# Patient Record
Sex: Female | Born: 1935 | Race: Asian | Hispanic: No | Smoking: Former smoker
Health system: Southern US, Community
[De-identification: ages and names within clinical notes are randomized; demographics above are authoritative.]

## PROBLEM LIST (undated history)

## (undated) DIAGNOSIS — Z5189 Encounter for other specified aftercare: Secondary | ICD-10-CM

## (undated) DIAGNOSIS — F329 Major depressive disorder, single episode, unspecified: Secondary | ICD-10-CM

## (undated) DIAGNOSIS — F32A Depression, unspecified: Secondary | ICD-10-CM

## (undated) DIAGNOSIS — R1310 Dysphagia, unspecified: Secondary | ICD-10-CM

## (undated) DIAGNOSIS — E785 Hyperlipidemia, unspecified: Secondary | ICD-10-CM

## (undated) DIAGNOSIS — K219 Gastro-esophageal reflux disease without esophagitis: Secondary | ICD-10-CM

## (undated) DIAGNOSIS — T8859XA Other complications of anesthesia, initial encounter: Secondary | ICD-10-CM

## (undated) DIAGNOSIS — S82843A Displaced bimalleolar fracture of unspecified lower leg, initial encounter for closed fracture: Secondary | ICD-10-CM

## (undated) DIAGNOSIS — R0602 Shortness of breath: Secondary | ICD-10-CM

## (undated) DIAGNOSIS — M810 Age-related osteoporosis without current pathological fracture: Secondary | ICD-10-CM

## (undated) DIAGNOSIS — Z9889 Other specified postprocedural states: Secondary | ICD-10-CM

## (undated) DIAGNOSIS — K623 Rectal prolapse: Secondary | ICD-10-CM

## (undated) DIAGNOSIS — M1990 Unspecified osteoarthritis, unspecified site: Secondary | ICD-10-CM

## (undated) DIAGNOSIS — T4145XA Adverse effect of unspecified anesthetic, initial encounter: Secondary | ICD-10-CM

## (undated) DIAGNOSIS — R112 Nausea with vomiting, unspecified: Secondary | ICD-10-CM

## (undated) HISTORY — PX: CATARACT EXTRACTION: SUR2

## (undated) HISTORY — DX: Encounter for other specified aftercare: Z51.89

## (undated) HISTORY — PX: COLONOSCOPY: SHX174

## (undated) HISTORY — PX: FRACTURE SURGERY: SHX138

## (undated) HISTORY — PX: HEMORRHOID SURGERY: SHX153

## (undated) HISTORY — PX: TOE SURGERY: SHX1073

---

## 1999-03-02 ENCOUNTER — Encounter: Payer: Self-pay | Admitting: Cardiology

## 1999-03-02 ENCOUNTER — Encounter: Admission: RE | Admit: 1999-03-02 | Discharge: 1999-03-02 | Payer: Self-pay | Admitting: Cardiology

## 1999-04-12 ENCOUNTER — Other Ambulatory Visit: Admission: RE | Admit: 1999-04-12 | Discharge: 1999-04-12 | Payer: Self-pay | Admitting: Cardiology

## 2000-03-13 ENCOUNTER — Encounter: Payer: Self-pay | Admitting: Internal Medicine

## 2000-03-13 ENCOUNTER — Encounter: Admission: RE | Admit: 2000-03-13 | Discharge: 2000-03-13 | Payer: Self-pay | Admitting: Internal Medicine

## 2000-05-27 ENCOUNTER — Encounter (INDEPENDENT_AMBULATORY_CARE_PROVIDER_SITE_OTHER): Payer: Self-pay | Admitting: Specialist

## 2000-05-27 ENCOUNTER — Ambulatory Visit (HOSPITAL_COMMUNITY): Admission: RE | Admit: 2000-05-27 | Discharge: 2000-05-27 | Payer: Self-pay | Admitting: Gastroenterology

## 2000-12-06 ENCOUNTER — Emergency Department (HOSPITAL_COMMUNITY): Admission: EM | Admit: 2000-12-06 | Discharge: 2000-12-06 | Payer: Self-pay | Admitting: Emergency Medicine

## 2001-03-18 ENCOUNTER — Encounter: Admission: RE | Admit: 2001-03-18 | Discharge: 2001-03-18 | Payer: Self-pay | Admitting: Internal Medicine

## 2001-03-18 ENCOUNTER — Encounter: Payer: Self-pay | Admitting: Internal Medicine

## 2002-04-10 ENCOUNTER — Encounter: Admission: RE | Admit: 2002-04-10 | Discharge: 2002-04-10 | Payer: Self-pay | Admitting: Internal Medicine

## 2002-04-10 ENCOUNTER — Encounter: Payer: Self-pay | Admitting: Internal Medicine

## 2002-07-13 ENCOUNTER — Encounter: Admission: RE | Admit: 2002-07-13 | Discharge: 2002-07-13 | Payer: Self-pay | Admitting: Internal Medicine

## 2002-07-13 ENCOUNTER — Encounter: Payer: Self-pay | Admitting: Internal Medicine

## 2002-08-12 ENCOUNTER — Encounter: Payer: Self-pay | Admitting: General Surgery

## 2002-08-17 ENCOUNTER — Ambulatory Visit (HOSPITAL_COMMUNITY): Admission: RE | Admit: 2002-08-17 | Discharge: 2002-08-17 | Payer: Self-pay | Admitting: General Surgery

## 2002-08-17 ENCOUNTER — Encounter (INDEPENDENT_AMBULATORY_CARE_PROVIDER_SITE_OTHER): Payer: Self-pay

## 2003-06-08 ENCOUNTER — Encounter: Admission: RE | Admit: 2003-06-08 | Discharge: 2003-06-08 | Payer: Self-pay | Admitting: Internal Medicine

## 2003-11-15 ENCOUNTER — Encounter: Admission: RE | Admit: 2003-11-15 | Discharge: 2003-11-15 | Payer: Self-pay | Admitting: Internal Medicine

## 2004-06-20 ENCOUNTER — Encounter: Admission: RE | Admit: 2004-06-20 | Discharge: 2004-06-20 | Payer: Self-pay | Admitting: Internal Medicine

## 2004-12-05 ENCOUNTER — Ambulatory Visit (HOSPITAL_COMMUNITY): Admission: RE | Admit: 2004-12-05 | Discharge: 2004-12-05 | Payer: Self-pay | Admitting: Ophthalmology

## 2005-06-29 ENCOUNTER — Encounter: Admission: RE | Admit: 2005-06-29 | Discharge: 2005-06-29 | Payer: Self-pay | Admitting: Internal Medicine

## 2006-07-02 ENCOUNTER — Encounter: Admission: RE | Admit: 2006-07-02 | Discharge: 2006-07-02 | Payer: Self-pay | Admitting: Internal Medicine

## 2007-07-30 ENCOUNTER — Encounter: Admission: RE | Admit: 2007-07-30 | Discharge: 2007-07-30 | Payer: Self-pay | Admitting: Internal Medicine

## 2008-04-09 ENCOUNTER — Encounter: Admission: RE | Admit: 2008-04-09 | Discharge: 2008-04-09 | Payer: Self-pay | Admitting: Internal Medicine

## 2008-04-13 ENCOUNTER — Encounter: Admission: RE | Admit: 2008-04-13 | Discharge: 2008-04-13 | Payer: Self-pay | Admitting: Internal Medicine

## 2009-03-15 ENCOUNTER — Encounter: Admission: RE | Admit: 2009-03-15 | Discharge: 2009-03-15 | Payer: Self-pay | Admitting: Internal Medicine

## 2010-03-16 ENCOUNTER — Encounter: Admission: RE | Admit: 2010-03-16 | Discharge: 2010-03-16 | Payer: Self-pay | Admitting: Internal Medicine

## 2010-09-15 NOTE — Op Note (Signed)
NAMEROBERTTA, [name]                 ACCOUNT NO.:  [id number]   MEDICAL RECORD NO.:  [id number]          PATIENT TYPE:  OIB   LOCATION:  NA                           FACILITY:  [facility]   PHYSICIAN:  Guadelupe Sabin, M.D.DATE OF BIRTH:  07/07/1925   DATE OF PROCEDURE:  12/05/2004  DATE OF DISCHARGE:                                 OPERATIVE REPORT   PREOPERATIVE DIAGNOSIS:  Senile nuclear cataract, left eye.   POSTOPERATIVE DIAGNOSIS:  Senile nuclear cataract, left eye.   DATE OF OPERATION:  Planned extracapsular cataract extraction -  phacoemulsification, primary insertion of posterior chamber intraocular lens  implant.   SURGEON:  Dr. Cecilie Kicks.   ASSISTANT:  Nurse.   ANESTHESIA:  Local 4% Xylocaine, 0.75 Marcaine with Wydase added  retrobulbar block, topical tetracaine, intraocular Xylocaine. Anesthesia  standby required. Patient given sodium Pentothal intravenously during the  period of retrobulbar blocking.   OPERATIVE PROCEDURE:  After the patient was prepped and draped, a lid  speculum was inserted in the left eye. Schiotz tonometry was recorded at 4  to 5 scale units with a 5.5 grams weight. A peritomy was performed adjacent  to the limbus from the 11 to the 1 o'clock position. The corneoscleral  junction was cleaned and a corneoscleral groove made with a 45 degrees super  blade.  The anterior chamber was then entered with a 2.5 mm diamond keratome  at the 12 o'clock position and a 15 degrees blade at the 2:30 position.  Using a bent 26 gauge needle on an OcuCoat syringe, a circular  capsulorrhexis was begun, but was difficult to form a smooth circle.  The  capsulotomy was fragmented, but allowed adequate opening to the nuclear and  cortical cataract. Hydrodissection and hydrodelineation were performed using  1% Xylocaine. The 30 degree phacoemulsification tip was then inserted with  slow controlled emulsification of the lens nucleus with back cracking and  fragmentation  with the Bechert pick. Total ultrasonic time 1 minute 8  seconds, average power level 14%, total amount of fluid used 90 mL.  Following removal of the nucleus. The residual cortex was aspirated with the  silicone tipped irrigation-aspiration probe. The posterior capsule appeared  intact with a brilliant red fundus reflex.  It was therefore elected to  insert an [employer] SI 40NB silicone three-piece posterior  chamber intraocular lens implant, diopter strength +22.00. This was inserted  with the McDonald forceps into the anterior chamber. The superior haptic of  the intraocular lens spontaneously was placed in the capsular bag. The lens  was then rotated and the inferior haptic placed in the bag with the [facility]  lens rotator. The lens itself was then turned slightly and appeared to be in  good centered position. The OcuCoat and Provisc which had been used  intermittently during the procedure were then aspirated and replaced with  balanced salt solution and Miochol ophthalmic solution. A single 10-0  interrupted nylon suture was used to close the larger incision at the 12  o'clock position.  There did not appeared to be any external leakage.  Maxitrol ointment  was instilled in the  conjunctival cul-de-sac and a light patch protector shield applied. Duration  of procedure and anesthesia administration, 45 minutes. The patient  tolerated the procedure well in general, left the operating room to the  recovery room in good condition.       HNJ/MEDQ  D:  12/05/2004  T:  12/05/2004  Job:  [id number]   cc:   Loraine Leriche A. Waynard Edwards, M.D.  [address]  [place]  [place] [address]  Fax: [phone]

## 2010-09-15 NOTE — H&P (Signed)
Jillian [name], Jillian [name]                 ACCOUNT NO.:  [id number]   MEDICAL RECORD NO.:  [id number]          PATIENT TYPE:  OIB   LOCATION:  NA                           FACILITY:  [facility]   PHYSICIAN:  Guadelupe Sabin, M.D.DATE OF BIRTH:  Sep 08, 1925   DATE OF ADMISSION:  12/05/2004  DATE OF DISCHARGE:                                HISTORY & PHYSICAL   This was a planned outpatient surgical admission of this 77 year old female,  admitted to for cataract implant surgery of the left eye.   PRESENT ILLNESS:  This patient has been followed in my office, since November 23, 1966, for routine eye care.  Initial examination revealed a visual  acuity of 20/20 right eye, 20/25 left eye without correction.  Over the  ensuing years, the patient has done well without major eye problems.  Recently, in 2000 the patient was noted to have early nuclear cataract  formation and this has very slowly progressed.  The patient has had  difficulty seeing with and without her glasses and when seen in July she  stated that she was now having to close the right eye to see well.  The  patient noted it was getting increasingly difficult to read, and that she  was having to use a magnifier.  Examination revealed a visual acuity of  20/25 right eye, 20/25 left eye with correction at distance and 20/30 to  20/40 at near.  A dense nuclear cataract formation was present and it was  felt advisable to proceed with cataract implant surgery of the left eye now,  right eye later.  The patient was given oral discussion and printed  information concerning the procedure and its possible complications.  She  signed an informed consent and arrangements made for outpatient admission at  this time.   PAST MEDICAL HISTORY:  The patient is under the care of Dr.  Waynard Edwards.  Please  send him copies of these notes and any associated laboratory data, EKG or  chest x-ray material.   CURRENT MEDICATIONS:  Prilosec, vitamins, calcium, Tylenol,  Relenza, Evista,  Caltrate, Centrum Silver.   The patient is felt to be in generally good condition and an acceptable risk  for the proposed surgery under local anesthesia.   ALLERGIES:  None.   REVIEW OF SYSTEMS:  No cardiorespiratory complaints.   PHYSICAL EXAMINATION:  VITAL SIGNS:  As recorded on admission, blood  pressure 116/71, pulse 52, respirations 16, temperature 97.2.  GENERAL APPEARANCE:  The patient is a pleasant well-nourished, well-  developed [place] female in no acute distress.  HEENT:  EYES:  Visual acuity as noted above.  Slit lamp examination:  Dense  nuclear cataract formation.  Applanation tonometry 16-mm each eye.  Dilated  fundus examination:  Cataract lens haze and dimness.  The vitreous is clear.  Retina attached with normal optic nerve blood vessels and macula.  CHEST:  Lungs clear to percussion/auscultation.  HEART:  Normal sinus rhythm.  No cardiomegaly.  No murmurs.  ABDOMEN:  Negative.  EXTREMITIES:  Negative.   ADMISSION DIAGNOSIS:  Senile nuclear cataract, both eyes.  SURGICAL PLAN:  Cataract implant surgery left eye now, right eye later.       HNJ/MEDQ  D:  12/05/2004  T:  12/05/2004  Job:  [id number]   cc:   Loraine Leriche A. Waynard Edwards, M.D.  [address]  [place]  [place] [address]  Fax: [phone]

## 2011-03-02 ENCOUNTER — Other Ambulatory Visit: Payer: Self-pay | Admitting: Internal Medicine

## 2011-03-02 DIAGNOSIS — Z1231 Encounter for screening mammogram for malignant neoplasm of breast: Secondary | ICD-10-CM

## 2011-03-31 HISTORY — PX: TEAR DUCT PROBING: SHX793

## 2011-04-09 ENCOUNTER — Ambulatory Visit
Admission: RE | Admit: 2011-04-09 | Discharge: 2011-04-09 | Disposition: A | Discharge status: Home / Self Care | Payer: Medicare Other | Source: Ambulatory Visit | Attending: Internal Medicine | Admitting: Internal Medicine

## 2011-04-09 DIAGNOSIS — Z1231 Encounter for screening mammogram for malignant neoplasm of breast: Secondary | ICD-10-CM

## 2011-11-06 ENCOUNTER — Inpatient Hospital Stay (HOSPITAL_COMMUNITY)
Admission: EM | Admit: 2011-11-06 | Discharge: 2011-11-10 | DRG: 494 | Disposition: A | Discharge status: Home Health | Payer: Medicare Other | Attending: Orthopedic Surgery | Admitting: Orthopedic Surgery

## 2011-11-06 ENCOUNTER — Emergency Department (HOSPITAL_COMMUNITY): Payer: Medicare Other

## 2011-11-06 ENCOUNTER — Encounter (HOSPITAL_COMMUNITY): Payer: Self-pay | Admitting: Emergency Medicine

## 2011-11-06 DIAGNOSIS — Z886 Allergy status to analgesic agent status: Secondary | ICD-10-CM

## 2011-11-06 DIAGNOSIS — Z7901 Long term (current) use of anticoagulants: Secondary | ICD-10-CM

## 2011-11-06 DIAGNOSIS — W108XXA Fall (on) (from) other stairs and steps, initial encounter: Secondary | ICD-10-CM | POA: Diagnosis present

## 2011-11-06 DIAGNOSIS — Y92009 Unspecified place in unspecified non-institutional (private) residence as the place of occurrence of the external cause: Secondary | ICD-10-CM

## 2011-11-06 DIAGNOSIS — S82843A Displaced bimalleolar fracture of unspecified lower leg, initial encounter for closed fracture: Principal | ICD-10-CM | POA: Diagnosis present

## 2011-11-06 DIAGNOSIS — Z882 Allergy status to sulfonamides status: Secondary | ICD-10-CM

## 2011-11-06 DIAGNOSIS — Z79899 Other long term (current) drug therapy: Secondary | ICD-10-CM

## 2011-11-06 DIAGNOSIS — S82209A Unspecified fracture of shaft of unspecified tibia, initial encounter for closed fracture: Secondary | ICD-10-CM

## 2011-11-06 DIAGNOSIS — M810 Age-related osteoporosis without current pathological fracture: Secondary | ICD-10-CM | POA: Diagnosis present

## 2011-11-06 HISTORY — DX: Age-related osteoporosis without current pathological fracture: M81.0

## 2011-11-06 HISTORY — DX: Displaced bimalleolar fracture of unspecified lower leg, initial encounter for closed fracture: S82.843A

## 2011-11-06 LAB — CBC WITH DIFFERENTIAL/PLATELET
Eosinophils Absolute: 0 10*3/uL (ref 0.0–0.7)
Eosinophils Relative: 0 % (ref 0–5)
Lymphs Abs: 0.9 10*3/uL (ref 0.7–4.0)
MCH: 33.2 pg (ref 26.0–34.0)
MCV: 96.1 fL (ref 78.0–100.0)
Monocytes Absolute: 0.5 10*3/uL (ref 0.1–1.0)
Platelets: 127 10*3/uL — ABNORMAL LOW (ref 150–400)
RBC: 3.85 MIL/uL — ABNORMAL LOW (ref 3.87–5.11)
RDW: 12.7 % (ref 11.5–15.5)

## 2011-11-06 LAB — PROTIME-INR
INR: 1.12 (ref 0.00–1.49)
Prothrombin Time: 14.6 seconds (ref 11.6–15.2)

## 2011-11-06 LAB — COMPREHENSIVE METABOLIC PANEL
ALT: 9 U/L (ref 0–35)
Calcium: 9.2 mg/dL (ref 8.4–10.5)
Creatinine, Ser: 0.7 mg/dL (ref 0.50–1.10)
GFR calc Af Amer: 90 mL/min — ABNORMAL LOW (ref >90)
Glucose, Bld: 123 mg/dL — ABNORMAL HIGH (ref 70–99)
Sodium: 137 mEq/L (ref 135–145)
Total Protein: 6.5 g/dL (ref 6.0–8.3)

## 2011-11-06 MED ORDER — HYDROMORPHONE BOLUS VIA INFUSION: 1.0000 mg | INTRAVENOUS | Status: DC | PRN

## 2011-11-06 MED ORDER — METHOCARBAMOL 100 MG/ML IJ SOLN
500.0000 mg | Freq: Four times a day (QID) | INTRAMUSCULAR | Status: DC | PRN
  Administered 2011-11-07: 500 mg via INTRAVENOUS
  Filled 2011-11-06 (×3): qty 5

## 2011-11-06 MED ORDER — ONDANSETRON HCL 4 MG/2ML IJ SOLN: 4.0000 mg | Freq: Three times a day (TID) | INTRAMUSCULAR | Status: AC | PRN

## 2011-11-06 MED ORDER — SODIUM CHLORIDE 0.9 % IV SOLN
INTRAVENOUS | Status: AC
  Administered 2011-11-06 (×2): via INTRAVENOUS

## 2011-11-06 MED ORDER — HYDROMORPHONE HCL PF 1 MG/ML IJ SOLN
1.0000 mg | INTRAMUSCULAR | Status: DC | PRN
  Administered 2011-11-06 – 2011-11-08 (×5): 1 mg via INTRAVENOUS
  Filled 2011-11-06 (×5): qty 1

## 2011-11-06 MED ORDER — OXYCODONE-ACETAMINOPHEN 5-325 MG PO TABS
2.0000 | ORAL_TABLET | Freq: Once | ORAL | Status: AC
  Administered 2011-11-06: 2 via ORAL
  Filled 2011-11-06: qty 2

## 2011-11-06 MED ORDER — HYDROMORPHONE HCL PF 1 MG/ML IJ SOLN
1.0000 mg | INTRAMUSCULAR | Status: AC | PRN
  Administered 2011-11-06: 1 mg via INTRAVENOUS
  Filled 2011-11-06: qty 1

## 2011-11-06 NOTE — H&P (Signed)
Jillian [name] is an 78 y.o. female.   Chief Complaint: Pain in left ankle. HPI: Pt. Missed a step at home while feeding her cat. She had immediate pain in her left ankle and could not put weight on her ankle.  ER xrays show a displaced left bimalleolar ankle fracture needing open reduction and internal fixation.  Past Medical History  Diagnosis Date  . Osteoporosis   . Ankle fracture, bimalleolar, closed     History reviewed. No pertinent past surgical history.  No family history on file. Social History:  reports that she has never smoked. She does not have any smokeless tobacco history on file. She reports that she does not drink alcohol or use illicit drugs.  Allergies:  Allergies  Allergen Reactions  . Aspirin Other (See Comments)    GI upset  . Ibuprofen Other (See Comments)    GI bleed precautions  . Sulfa Antibiotics Hives    Medications Prior to Admission  Medication Sig Dispense Refill  . cholecalciferol (VITAMIN D) 1000 UNITS tablet Take 1,000 Units by mouth daily.      Marland Kitchen escitalopram (LEXAPRO) 5 MG tablet Take 5 mg by mouth daily.      Marland Kitchen omeprazole (PRILOSEC) 20 MG capsule Take 20 mg by mouth daily.      . raloxifene (EVISTA) 60 MG tablet Take 60 mg by mouth daily.        Results for orders placed during the hospital encounter of 11/06/11 (from the past 48 hour(s))  CBC WITH DIFFERENTIAL     Status: Abnormal   Collection Time   11/06/11  1:42 AM      Component Value Range Comment   WBC 5.8  4.0 - 10.5 K/uL    RBC 3.85 (*) 3.87 - 5.11 MIL/uL    Hemoglobin 12.8  12.0 - 15.0 g/dL    HCT 16.1  09.6 - 04.5 %    MCV 96.1  78.0 - 100.0 fL    MCH 33.2  26.0 - 34.0 pg    MCHC 34.6  30.0 - 36.0 g/dL    RDW 40.9  81.1 - 91.4 %    Platelets 127 (*) 150 - 400 K/uL    Neutrophils Relative 75  43 - 77 %    Neutro Abs 4.3  1.7 - 7.7 K/uL    Lymphocytes Relative 15  12 - 46 %    Lymphs Abs 0.9  0.7 - 4.0 K/uL    Monocytes Relative 9  3 - 12 %    Monocytes Absolute 0.5  0.1  - 1.0 K/uL    Eosinophils Relative 0  0 - 5 %    Eosinophils Absolute 0.0  0.0 - 0.7 K/uL    Basophils Relative 0  0 - 1 %    Basophils Absolute 0.0  0.0 - 0.1 K/uL   COMPREHENSIVE METABOLIC PANEL     Status: Abnormal   Collection Time   11/06/11  1:42 AM      Component Value Range Comment   Sodium 137  135 - 145 mEq/L    Potassium 3.8  3.5 - 5.1 mEq/L    Chloride 100  96 - 112 mEq/L    CO2 26  19 - 32 mEq/L    Glucose, Bld 123 (*) 70 - 99 mg/dL    BUN 12  6 - 23 mg/dL    Creatinine, Ser 7.82  0.50 - 1.10 mg/dL    Calcium 9.2  8.4 - 95.6 mg/dL  Total Protein 6.5  6.0 - 8.3 g/dL    Albumin 3.6  3.5 - 5.2 g/dL    AST 22  0 - 37 U/L    ALT 9  0 - 35 U/L    Alkaline Phosphatase 62  39 - 117 U/L    Total Bilirubin 0.4  0.3 - 1.2 mg/dL    GFR calc non Af Amer 77 (*) >90 mL/min    GFR calc Af Amer 90 (*) >90 mL/min    Dg Tibia/fibula Left  11/06/2011  *RADIOLOGY REPORT*  Clinical Data: Fall  LEFT TIBIA AND FIBULA - 2 VIEW  Comparison: None.  Findings: Distal intra-articular tibia and fibula fractures are noted.  Mid and proximal tibia and fibula are intact.  IMPRESSION: Distal tibia and fibula fractures.  Original Report Authenticated By: Donavan Burnet, M.D.   Dg Tibia/fibula Right  11/06/2011  *RADIOLOGY REPORT*  Clinical Data: Fall  RIGHT TIBIA AND FIBULA - 2 VIEW  Comparison: None.  Findings: No acute fracture and no dislocation.  Chondrocalcinosis in the knee is present.  IMPRESSION: No acute bony pathology.  Original Report Authenticated By: Donavan Burnet, M.D.   Dg Ankle Complete Left  11/06/2011  *RADIOLOGY REPORT*  Clinical Data: Fall  LEFT ANKLE COMPLETE - 3+ VIEW  Comparison: None.  Findings: There is a fracture through the distal fibula extending into the ankle joint.  The fracture begins at the tibial plafond and extends superiorly.  Fracture fragments are displaced.  There is also fracture involving the medial malleolus extending into the ankle joint.  There is widening of the  medial ankle mortise.  There is slight anterior subluxation of the tibial plafond with respect to the talar dome.  Talus and calcaneus are intact.  IMPRESSION: Bimalleolar fracture with disruption of the mortis.  Original Report Authenticated By: Donavan Burnet, M.D.   Dg Ankle Complete Right  11/06/2011  *RADIOLOGY REPORT*  Clinical Data: Fall  RIGHT ANKLE - COMPLETE 3+ VIEW  Comparison: None.  Findings: No acute fracture and no dislocation.  IMPRESSION: No acute bony pathology.  Original Report Authenticated By: Donavan Burnet, M.D.   Dg Foot Complete Left  11/06/2011  *RADIOLOGY REPORT*  Clinical Data: Fall  LEFT FOOT - COMPLETE 3+ VIEW  Comparison: None.  Findings: Fractures of the distal tibia and fibula are noted. Phalanges and tarsal bones are intact.  Talus and calcaneus are grossly intact.  IMPRESSION: No acute bony injury in the foot.  Distal tibia and fibula fractures are noted.  Original Report Authenticated By: Donavan Burnet, M.D.    Review of Systems  Constitutional: Negative.   HENT: Negative.   Eyes: Negative.   Respiratory: Negative.   Cardiovascular: Negative.   Gastrointestinal: Negative.   Genitourinary: Positive for frequency.  Musculoskeletal: Positive for joint pain.  Skin: Negative.   Neurological: Negative.   Endo/Heme/Allergies: Bruises/bleeds easily.  Psychiatric/Behavioral: Negative.     Blood pressure 108/59, pulse 55, temperature 97.6 F (36.4 C), temperature source Oral, resp. rate 18, height 5\' 2"  (1.575 m), weight 62.3 kg (137 lb 5.6 oz), SpO2 99.00%. Physical Exam  Constitutional: She is oriented to person, place, and time. She appears well-developed.  HENT:  Head: Normocephalic and atraumatic.  Eyes: Conjunctivae are normal.  Neck: Normal range of motion.  Cardiovascular: Normal rate.   Respiratory: Effort normal and breath sounds normal.  GI: Soft. Bowel sounds are normal.  Musculoskeletal: She exhibits tenderness.  Neurological: She is alert and  oriented to person,  place, and time.  Skin: Skin is warm.  Psychiatric: She has a normal mood and affect. Judgment normal.     Assessment/Plan Displaced left ankle fracture/ ORIF Lt. Ankle   Taejah Ohalloran III,Tristan Bramble L 11/06/2011, 8:07 AM

## 2011-11-06 NOTE — ED Notes (Signed)
PT. ARRIVED WITH PTAR FROM HOME TRIPPED WHILE GOING DOWN STAIR ( 3 STEPS FROM FLOOR) AND TWISTES LEFT ANKLE , PRESENTS WITH PAIN /SWELLING AT LEFT ANKLE / SMALL ABRASION AT RIGHT FOOT , NO LOC .

## 2011-11-06 NOTE — ED Notes (Signed)
TRANSPORTED TO X-RAY. 

## 2011-11-06 NOTE — ED Provider Notes (Signed)
History     CSN: [id number]  Arrival date & time 11/06/11  0013   First MD Initiated Contact with Patient 11/06/11 0029      Chief Complaint  Patient presents with  . Ankle Injury    (Consider location/radiation/quality/duration/timing/severity/associated sxs/prior treatment) Patient is a 78 y.o. female presenting with lower extremity injury. The history is provided by the patient. No language interpreter was used.  Ankle Injury This is a new problem. The current episode started today. The problem occurs constantly. The problem has been unchanged. Pertinent negatives include no chest pain, fever, nausea, neck pain, vomiting or weakness. The symptoms are aggravated by bending. She has tried nothing for the symptoms.  78 year old female coming in complaining of bilateral ankle pain after falling down steps carrying a box down steps. States that she got her husband's attention who has Alzheimer's and he brought her the phone and she called her daughter. Daughter came and called 911. Deformity noted to the right than the left ankle. 2+ bilateral pedal pulses. Patient has good CMS to the seats. Limited range of motion due to pain. States that her orthopedic Dr. Is Dr. Ethelene Hal. Past medical history of osteoporosis and chronic pain. Denies loss of consciousness or hitting her head.  Past Medical History  Diagnosis Date  . Osteoporosis     History reviewed. No pertinent past surgical history.  No family history on file.  History  Substance Use Topics  . Smoking status: Never Smoker   . Smokeless tobacco: Not on file  . Alcohol Use: No    OB History    Grav Para Term Preterm Abortions TAB SAB Ect Mult Living                  Review of Systems  Constitutional: Negative.  Negative for fever.  HENT: Negative.  Negative for neck pain.   Eyes: Negative.   Respiratory: Negative.   Cardiovascular: Negative.  Negative for chest pain.  Gastrointestinal: Negative.  Negative for nausea and  vomiting.  Musculoskeletal:       Ankle pain bilateral  Neurological: Negative.  Negative for weakness.  Psychiatric/Behavioral: Negative.   All other systems reviewed and are negative.    Allergies  Review of patient's allergies indicates not on file.  Home Medications  No current outpatient prescriptions on file.  BP 155/62  Pulse 58  Temp 97.8 F (36.6 C) (Oral)  Resp 28  SpO2 100%  Physical Exam  Nursing note and vitals reviewed. Constitutional: She is oriented to person, place, and time. She appears well-developed and well-nourished.  HENT:  Head: Normocephalic and atraumatic.  Eyes: Conjunctivae and EOM are normal. Pupils are equal, round, and reactive to light.  Neck: Normal range of motion. Neck supple.  Cardiovascular: Normal rate.   Pulmonary/Chest: Effort normal.  Abdominal: Soft.  Musculoskeletal: Normal range of motion. She exhibits edema and tenderness.       Bilateral ankle tenderness with deformity noted to L ankle  Neurological: She is alert and oriented to person, place, and time. She has normal reflexes.  Skin: Skin is warm and dry.  Psychiatric: She has a normal mood and affect.    ED Course  Procedures (including critical care time)  Labs Reviewed - No data to display No results found.   No diagnosis found.    MDM  Patient will lead admitted to unit 5000 for tib-fib fracture on the left. Dr. Alfredo Bach will see her in the morning. Admission temporary admission orders have been  placed. Labs pending.         Remi Haggard, NP 11/06/11 [phone]

## 2011-11-06 NOTE — ED Notes (Signed)
REPORT GIVEN TO FLOOR NURSE WILL TRANSPORT AFTER APPLYING LEFT SHORT LEG SPLINT, ORTHO TECH PAGED.

## 2011-11-06 NOTE — ED Provider Notes (Addendum)
78 year old female presents after falling down the stairs when she lost her balance after an ankle injury, has associated swelling and bruising of the left ankle and pain in the right ankle. This was acute in onset, persistent, moderate to severe and prevents ambulation. She denies head injury.  Physical exam: Patient has tenderness and bruising and swelling to the left ankle with decreased range of motion. There are normal pulses at the bilateral dorsalis pedis. There is no tenderness over the proximal fibula on the left. Right ankle with normal range of motion, no swelling, no decreased range of motion. Abdomen is soft, lungs and heart are normal exam, patient does not appear to be in acute distress.  Assessment: Patient appears to have focal orthopedic trauma, I personally discussed her care with the orthopedist on call Dr. Simonne Come, who will see the patient in the morning, has agreed to have holding orders placed to his service.  Medical screening examination/treatment/procedure(s) were conducted as a shared visit with non-physician practitioner(s) and myself.  I personally evaluated the patient during the encounter  .ED ECG REPORT  I personally interpreted this EKG   Date: 11/06/2011   Rate: 56  Rhythm: sinus bradycardia  QRS Axis: normal  Intervals: normal  ST/T Wave abnormalities: normal  Conduction Disutrbances:none  Narrative Interpretation:   Old EKG Reviewed: Appeared with 12/01/2004, bradycardia still present, no significant changes   Vida Roller, MD 11/06/11 0159  Vida Roller, MD 11/06/11 [phone]

## 2011-11-06 NOTE — Anesthesia Preprocedure Evaluation (Addendum)
Anesthesia Evaluation  Patient identified by MRN, date of birth, ID band Patient awake    Reviewed: Allergy & Precautions, H&P , NPO status , Patient's Chart, lab work & pertinent test results  Airway Mallampati: I TM Distance: >3 FB Neck ROM: Full    Dental  (+) Dental Advisory Given, Upper Dentures, Teeth Intact and Implants   Pulmonary  breath sounds clear to auscultation        Cardiovascular Rhythm:Regular Rate:Normal     Neuro/Psych    GI/Hepatic GERD-  Controlled and Medicated,  Endo/Other    Renal/GU      Musculoskeletal   Abdominal   Peds  Hematology   Anesthesia Other Findings   Reproductive/Obstetrics                           Anesthesia Physical Anesthesia Plan  ASA: II  Anesthesia Plan: General   Post-op Pain Management:    Induction: Intravenous  Airway Management Planned: LMA  Additional Equipment:   Intra-op Plan:   Post-operative Plan: Extubation in OR  Informed Consent: I have reviewed the patients History and Physical, chart, labs and discussed the procedure including the risks, benefits and alternatives for the proposed anesthesia with the patient or authorized representative who has indicated his/her understanding and acceptance.     Plan Discussed with: Surgeon and CRNA  Anesthesia Plan Comments:         Anesthesia Quick Evaluation

## 2011-11-07 ENCOUNTER — Inpatient Hospital Stay (HOSPITAL_COMMUNITY): Payer: Medicare Other | Admitting: Anesthesiology

## 2011-11-07 ENCOUNTER — Encounter (HOSPITAL_COMMUNITY): Payer: Self-pay | Admitting: Anesthesiology

## 2011-11-07 ENCOUNTER — Inpatient Hospital Stay (HOSPITAL_COMMUNITY): Payer: Medicare Other

## 2011-11-07 ENCOUNTER — Encounter (HOSPITAL_COMMUNITY)
Admission: EM | Disposition: A | Discharge status: Home Health | Payer: Self-pay | Source: Home / Self Care | Attending: Orthopedic Surgery

## 2011-11-07 HISTORY — PX: ORIF ANKLE FRACTURE: SHX5408

## 2011-11-07 LAB — BASIC METABOLIC PANEL
GFR calc Af Amer: >90 mL/min (ref >90)
GFR calc non Af Amer: 80 mL/min — ABNORMAL LOW (ref >90)
Glucose, Bld: 135 mg/dL — ABNORMAL HIGH (ref 70–99)
Potassium: 3.9 mEq/L (ref 3.5–5.1)
Sodium: 137 mEq/L (ref 135–145)

## 2011-11-07 LAB — SURGICAL PCR SCREEN
MRSA, PCR: NEGATIVE
Staphylococcus aureus: NEGATIVE

## 2011-11-07 LAB — CBC
Hemoglobin: 11.5 g/dL — ABNORMAL LOW (ref 12.0–15.0)
Platelets: 114 10*3/uL — ABNORMAL LOW (ref 150–400)
RBC: 3.58 MIL/uL — ABNORMAL LOW (ref 3.87–5.11)
WBC: 5.7 10*3/uL (ref 4.0–10.5)

## 2011-11-07 SURGERY — OPEN REDUCTION INTERNAL FIXATION (ORIF) ANKLE FRACTURE
Anesthesia: General | Site: Ankle | Laterality: Left | Wound class: Clean

## 2011-11-07 MED ORDER — EPHEDRINE SULFATE 50 MG/ML IJ SOLN
INTRAMUSCULAR | Status: DC | PRN
  Administered 2011-11-07: 10 mg via INTRAVENOUS

## 2011-11-07 MED ORDER — FENTANYL CITRATE 0.05 MG/ML IJ SOLN: 50.0000 ug | INTRAMUSCULAR | Status: DC | PRN

## 2011-11-07 MED ORDER — CEFAZOLIN SODIUM 1-5 GM-% IV SOLN
1.0000 g | Freq: Four times a day (QID) | INTRAVENOUS | Status: AC
  Administered 2011-11-07 – 2011-11-08 (×3): 1 g via INTRAVENOUS
  Filled 2011-11-07 (×3): qty 50

## 2011-11-07 MED ORDER — 0.9 % SODIUM CHLORIDE (POUR BTL) OPTIME
TOPICAL | Status: DC | PRN
  Administered 2011-11-07: 1000 mL

## 2011-11-07 MED ORDER — FENTANYL CITRATE 0.05 MG/ML IJ SOLN
INTRAMUSCULAR | Status: DC | PRN
  Administered 2011-11-07: 50 ug via INTRAVENOUS
  Administered 2011-11-07 (×2): 25 ug via INTRAVENOUS
  Administered 2011-11-07 (×3): 50 ug via INTRAVENOUS

## 2011-11-07 MED ORDER — HYDROMORPHONE HCL PF 1 MG/ML IJ SOLN
0.2500 mg | INTRAMUSCULAR | Status: DC | PRN
  Administered 2011-11-07: 0.25 mg via INTRAVENOUS
  Administered 2011-11-07: 0.5 mg via INTRAVENOUS

## 2011-11-07 MED ORDER — PROPOFOL 10 MG/ML IV EMUL
INTRAVENOUS | Status: DC | PRN
  Administered 2011-11-07: 100 mg via INTRAVENOUS
  Administered 2011-11-07: 30 mg via INTRAVENOUS

## 2011-11-07 MED ORDER — MIDAZOLAM HCL 5 MG/5ML IJ SOLN
INTRAMUSCULAR | Status: DC | PRN
  Administered 2011-11-07: 2 mg via INTRAVENOUS

## 2011-11-07 MED ORDER — MIDAZOLAM HCL 2 MG/2ML IJ SOLN: 1.0000 mg | INTRAMUSCULAR | Status: DC | PRN

## 2011-11-07 MED ORDER — METOCLOPRAMIDE HCL 10 MG PO TABS: 5.0000 mg | ORAL_TABLET | Freq: Three times a day (TID) | ORAL | Status: DC | PRN

## 2011-11-07 MED ORDER — OXYCODONE-ACETAMINOPHEN 5-325 MG PO TABS
1.0000 | ORAL_TABLET | ORAL | Status: DC | PRN
  Administered 2011-11-08: 2 via ORAL
  Administered 2011-11-08 – 2011-11-09 (×2): 1 via ORAL
  Filled 2011-11-07 (×2): qty 1
  Filled 2011-11-07: qty 2
  Filled 2011-11-07: qty 1

## 2011-11-07 MED ORDER — RIVAROXABAN 10 MG PO TABS
10.0000 mg | ORAL_TABLET | Freq: Every day | ORAL | Status: DC
  Administered 2011-11-08: 10 mg via ORAL
  Filled 2011-11-07: qty 1

## 2011-11-07 MED ORDER — ONDANSETRON HCL 4 MG/2ML IJ SOLN: 4.0000 mg | Freq: Four times a day (QID) | INTRAMUSCULAR | Status: DC | PRN

## 2011-11-07 MED ORDER — CEFAZOLIN SODIUM 1-5 GM-% IV SOLN
INTRAVENOUS | Status: DC | PRN
  Administered 2011-11-07: 2 g via INTRAVENOUS

## 2011-11-07 MED ORDER — METOCLOPRAMIDE HCL 5 MG/ML IJ SOLN: 5.0000 mg | Freq: Three times a day (TID) | INTRAMUSCULAR | Status: DC | PRN

## 2011-11-07 MED ORDER — ONDANSETRON HCL 4 MG/2ML IJ SOLN
INTRAMUSCULAR | Status: DC | PRN
  Administered 2011-11-07: 4 mg via INTRAVENOUS

## 2011-11-07 MED ORDER — LACTATED RINGERS IV SOLN
INTRAVENOUS | Status: DC | PRN
  Administered 2011-11-07 (×2): via INTRAVENOUS

## 2011-11-07 MED ORDER — HYDROMORPHONE HCL PF 1 MG/ML IJ SOLN
INTRAMUSCULAR | Status: AC
  Administered 2011-11-07: 0.25 mg via INTRAVENOUS
  Filled 2011-11-07: qty 1

## 2011-11-07 MED ORDER — ONDANSETRON HCL 4 MG/2ML IJ SOLN: 4.0000 mg | Freq: Once | INTRAMUSCULAR | Status: DC | PRN

## 2011-11-07 MED ORDER — ONDANSETRON HCL 4 MG PO TABS: 4.0000 mg | ORAL_TABLET | Freq: Four times a day (QID) | ORAL | Status: DC | PRN

## 2011-11-07 MED ORDER — LIDOCAINE HCL (CARDIAC) 20 MG/ML IV SOLN
INTRAVENOUS | Status: DC | PRN
  Administered 2011-11-07: 100 mg via INTRAVENOUS

## 2011-11-07 MED ORDER — SODIUM CHLORIDE 0.9 % IV SOLN
INTRAVENOUS | Status: AC
  Administered 2011-11-08: 09:00:00 via INTRAVENOUS

## 2011-11-07 SURGICAL SUPPLY — 77 items
BAG DECANTER FOR FLEXI CONT (MISCELLANEOUS) IMPLANT
BANDAGE ELASTIC 4 VELCRO ST LF (GAUZE/BANDAGES/DRESSINGS) IMPLANT
BANDAGE ELASTIC 6 VELCRO ST LF (GAUZE/BANDAGES/DRESSINGS) IMPLANT
BANDAGE GAUZE ELAST BULKY 4 IN (GAUZE/BANDAGES/DRESSINGS) IMPLANT
BIT DRILL 2.5X110 QC LCP DISP (BIT) ×2 IMPLANT
BNDG ESMARK 4X9 LF (GAUZE/BANDAGES/DRESSINGS) ×2 IMPLANT
CANISTER SUCTION 2500CC (MISCELLANEOUS) ×2 IMPLANT
CLOTH BEACON ORANGE TIMEOUT ST (SAFETY) ×2 IMPLANT
COVER SURGICAL LIGHT HANDLE (MISCELLANEOUS) ×4 IMPLANT
CUFF TOURNIQUET SINGLE 18IN (TOURNIQUET CUFF) IMPLANT
CUFF TOURNIQUET SINGLE 24IN (TOURNIQUET CUFF) IMPLANT
CUFF TOURNIQUET SINGLE 34IN LL (TOURNIQUET CUFF) ×2 IMPLANT
CUFF TOURNIQUET SINGLE 44IN (TOURNIQUET CUFF) IMPLANT
DRAPE C-ARM 42X72 X-RAY (DRAPES) ×2 IMPLANT
DRAPE OEC MINIVIEW 54X84 (DRAPES) IMPLANT
DRAPE PROXIMA HALF (DRAPES) ×2 IMPLANT
DRAPE SURG 17X23 STRL (DRAPES) IMPLANT
DRAPE U-SHAPE 47X51 STRL (DRAPES) ×2 IMPLANT
DRSG ADAPTIC 3X8 NADH LF (GAUZE/BANDAGES/DRESSINGS) ×2 IMPLANT
DRSG PAD ABDOMINAL 8X10 ST (GAUZE/BANDAGES/DRESSINGS) ×4 IMPLANT
DURAPREP 26ML APPLICATOR (WOUND CARE) ×2 IMPLANT
ELECT REM PT RETURN 9FT ADLT (ELECTROSURGICAL) ×2
ELECTRODE REM PT RTRN 9FT ADLT (ELECTROSURGICAL) ×1 IMPLANT
FACESHIELD LNG OPTICON STERILE (SAFETY) ×2 IMPLANT
GLOVE BIO SURGEON STRL SZ 6.5 (GLOVE) ×6 IMPLANT
GLOVE BIOGEL PI IND STRL 6.5 (GLOVE) ×1 IMPLANT
GLOVE BIOGEL PI IND STRL 7.0 (GLOVE) ×4 IMPLANT
GLOVE BIOGEL PI IND STRL 8 (GLOVE) ×1 IMPLANT
GLOVE BIOGEL PI INDICATOR 6.5 (GLOVE) ×1
GLOVE BIOGEL PI INDICATOR 7.0 (GLOVE) ×4
GLOVE BIOGEL PI INDICATOR 8 (GLOVE) ×1
GLOVE SS BIOGEL STRL SZ 8 (GLOVE) ×1 IMPLANT
GLOVE SUPERSENSE BIOGEL SZ 8 (GLOVE) ×1
GLOVE SURG SS PI 6.5 STRL IVOR (GLOVE) ×2 IMPLANT
GOWN STRL NON-REIN LRG LVL3 (GOWN DISPOSABLE) ×10 IMPLANT
K-WIRE 1.6X150 (WIRE) ×4
K-WIRE 2.0X150M (WIRE) ×2
KIT 1/3 TUB PL 7H 85M (Orthopedic Implant) ×1 IMPLANT
KIT BASIN OR (CUSTOM PROCEDURE TRAY) ×2 IMPLANT
KIT ROOM TURNOVER OR (KITS) ×2 IMPLANT
KWIRE 1.6X150 (WIRE) ×2 IMPLANT
KWIRE 2.0X150M (WIRE) ×1 IMPLANT
MANIFOLD NEPTUNE II (INSTRUMENTS) IMPLANT
NS IRRIG 1000ML POUR BTL (IV SOLUTION) ×2 IMPLANT
PACK ORTHO EXTREMITY (CUSTOM PROCEDURE TRAY) ×2 IMPLANT
PAD ARMBOARD 7.5X6 YLW CONV (MISCELLANEOUS) ×4 IMPLANT
PAD CAST 4YDX4 CTTN HI CHSV (CAST SUPPLIES) ×2 IMPLANT
PADDING CAST COTTON 4X4 STRL (CAST SUPPLIES) ×2
PROS 1/3 TUB PL 7H 85M (Orthopedic Implant) ×2 IMPLANT
SCREW CANN L THRD/46 4.0 (Screw) ×2 IMPLANT
SCREW CORTEX 3.5 12MM (Screw) ×5 IMPLANT
SCREW CORTEX 3.5 14MM (Screw) ×2 IMPLANT
SCREW CORTEX 3.5 16MM (Screw) ×1 IMPLANT
SCREW CORTEX 3.5 18MM (Screw) ×1 IMPLANT
SCREW CORTEX 3.5 20MM (Screw) ×1 IMPLANT
SCREW CORTEX 3.5 26MM (Screw) ×1 IMPLANT
SCREW CORTEX 3.5 45MM (Screw) ×2 IMPLANT
SCREW LOCK CORT ST 3.5X12 (Screw) ×5 IMPLANT
SCREW LOCK CORT ST 3.5X14 (Screw) ×2 IMPLANT
SCREW LOCK CORT ST 3.5X16 (Screw) ×1 IMPLANT
SCREW LOCK CORT ST 3.5X18 (Screw) ×1 IMPLANT
SCREW LOCK CORT ST 3.5X20 (Screw) ×1 IMPLANT
SCREW LOCK CORT ST 3.5X26 (Screw) ×1 IMPLANT
SPONGE GAUZE 4X4 12PLY (GAUZE/BANDAGES/DRESSINGS) ×2 IMPLANT
SPONGE LAP 4X18 X RAY DECT (DISPOSABLE) ×2 IMPLANT
STAPLER VISISTAT 35W (STAPLE) IMPLANT
SUCTION FRAZIER TIP 10 FR DISP (SUCTIONS) ×2 IMPLANT
SUT ETHILON 4 0 PS 2 18 (SUTURE) ×4 IMPLANT
SUT VIC AB 0 CT1 27 (SUTURE) ×2
SUT VIC AB 0 CT1 27XBRD ANBCTR (SUTURE) ×2 IMPLANT
SUT VIC AB 2-0 CT1 27 (SUTURE) ×2
SUT VIC AB 2-0 CT1 TAPERPNT 27 (SUTURE) ×2 IMPLANT
TOWEL OR 17X24 6PK STRL BLUE (TOWEL DISPOSABLE) ×2 IMPLANT
TOWEL OR 17X26 10 PK STRL BLUE (TOWEL DISPOSABLE) ×2 IMPLANT
TUBE CONNECTING 12X1/4 (SUCTIONS) ×2 IMPLANT
WATER STERILE IRR 1000ML POUR (IV SOLUTION) IMPLANT
YANKAUER SUCT BULB TIP NO VENT (SUCTIONS) ×2 IMPLANT

## 2011-11-07 NOTE — Progress Notes (Signed)
Utilization review completed. Kamelia Lampkins, RN, BSN. 

## 2011-11-07 NOTE — Anesthesia Postprocedure Evaluation (Signed)
Anesthesia Post Note  Patient: Jillian [name]  Procedure(s) Performed: Procedure(s) (LRB): OPEN REDUCTION INTERNAL FIXATION (ORIF) ANKLE FRACTURE (Left)  Anesthesia type: general  Patient location: PACU  Post pain: Pain level controlled  Post assessment: Patient's Cardiovascular Status Stable  Last Vitals:  Filed Vitals:   11/07/11 1801  BP: 129/74  Pulse: 80  Temp:   Resp: 10    Post vital signs: Reviewed and stable  Level of consciousness: sedated  Complications: No apparent anesthesia complications

## 2011-11-07 NOTE — Transfer of Care (Signed)
Immediate Anesthesia Transfer of Care Note  Patient: Jillian [name]  Procedure(s) Performed: Procedure(s) (LRB): OPEN REDUCTION INTERNAL FIXATION (ORIF) ANKLE FRACTURE (Left)  Patient Location: PACU  Anesthesia Type: General  Level of Consciousness: sedated  Airway & Oxygen Therapy: Patient Spontanous Breathing and Patient connected to face mask oxygen  Post-op Assessment: Report given to PACU RN and Post -op Vital signs reviewed and stable  Post vital signs: stable  Complications: No apparent anesthesia complications

## 2011-11-07 NOTE — Anesthesia Procedure Notes (Signed)
Procedure Name: LMA Insertion Date/Time: 11/07/2011 3:22 PM Performed by: Jefm Miles E Pre-anesthesia Checklist: Patient identified, Timeout performed, Emergency Drugs available, Suction available and Patient being monitored Patient Re-evaluated:Patient Re-evaluated prior to inductionOxygen Delivery Method: Circle system utilized Preoxygenation: Pre-oxygenation with 100% oxygen Intubation Type: IV induction Ventilation: Mask ventilation without difficulty LMA: LMA inserted LMA Size: 4.0 Number of attempts: 1 Placement Confirmation: breath sounds checked- equal and bilateral and positive ETCO2 Tube secured with: Tape Dental Injury: Teeth and Oropharynx as per pre-operative assessment

## 2011-11-07 NOTE — Brief Op Note (Signed)
11/06/2011 - 11/07/2011  6:04 PM  PATIENT:  Jillian [name]  78 y.o. female  PRE-OPERATIVE DIAGNOSIS:  LEFT ANKLE FRACTURE:bimalleolar with widening of ankle mortice  POST-OPERATIVE DIAGNOSIS: SAME  PROCEDURE:  Procedure(s) (LRB): OPEN REDUCTION INTERNAL FIXATION (ORIF) ANKLE FRACTURE (Left)with screw in medial malleolus, 7 hole plate with intercalary and syndesmosis screws in distal fibula  SURGEON:  Surgeon(s) and Role:    * Drucilla Schmidt, MD - Primary  PHYSICIAN ASSISTANT:   ASSISTANTS: nurse  ANESTHESIA:   general  EBL:  Total I/O In: 1000 [I.V.:1000] Out: 550 [Urine:500; Blood:50]  BLOOD ADMINISTERED:none  DRAINS: none   LOCAL MEDICATIONS USED:  NONE  SPECIMEN:  No Specimen  DISPOSITION OF SPECIMEN:  N/A  COUNTS:  YES  TOURNIQUET:   Total Tourniquet Time Documented: Thigh (Left) - 90 minutes  DICTATION: .Other Dictation: Dictation Number [id number]  PLAN OF CARE: Admit to inpatient   PATIENT DISPOSITION:  PACU - hemodynamically stable.   Delay start of Pharmacological VTE agent (>24hrs) due to surgical blood loss or risk of bleeding: yes

## 2011-11-07 NOTE — Preoperative (Signed)
Beta Blockers   Reason not to administer Beta Blockers:Not Applicable 

## 2011-11-07 NOTE — H&P (Signed)
I agree with the H and P workup.

## 2011-11-08 ENCOUNTER — Encounter (HOSPITAL_COMMUNITY): Payer: Self-pay | Admitting: Orthopedic Surgery

## 2011-11-08 MED ORDER — RIVAROXABAN 10 MG PO TABS
10.0000 mg | ORAL_TABLET | Freq: Every day | ORAL | Status: DC
  Administered 2011-11-09 – 2011-11-10 (×2): 10 mg via ORAL
  Filled 2011-11-08 (×2): qty 1

## 2011-11-08 NOTE — Progress Notes (Signed)
Referral received for SNF placement for this patient. PT has provided recommendation for home with 24 hour supervision.  Spoke with patient- she wants to go home when stable. Per her permission- spoke with daughters Maxine Glenn and Fumiko [phone]).  Daughter Maxine Glenn lives in [place] and is flying in to [facility] Saturday morning. She states that she will plan to stay with her parents for "as long as necessary".  Daughter Fumiko stated that they would be looking at possible options for private care as well as Mr Nayak has Alzheimers Dementia.  Daughters state that they will not have any care at home until Saturday morning.  Will notify RNCM of above and talk with daughters in a.m.   MD:  Please clarify tentative d/c date.  Thank you.    Lorri Frederick. [place]  [phone]

## 2011-11-08 NOTE — Op Note (Signed)
[name], [name] NO.:  [id number]  MEDICAL RECORD NO.:  [id number]  LOCATION:  5N27C                        FACILITY:  [facility]  PHYSICIAN:  Marlowe Kays, M.D.  DATE OF BIRTH:  08/30/1925  DATE OF PROCEDURE:  11/07/2011 DATE OF DISCHARGE:                              OPERATIVE REPORT   PREOPERATIVE DIAGNOSIS:  Closed displaced bimalleolar fracture with widening of syndesmosis, left ankle.  POSTOPERATIVE DIAGNOSIS:  Closed displaced bimalleolar fracture with widening of syndesmosis, left ankle.  OPERATION:  ORIF of bimalleolar fracture with syndesmosis widening, left ankle.  SURGEON:  Marlowe Kays, MD  ASSISTANT:  Nurse.  ANESTHESIA:  General.  PATHOLOGY AND JUSTIFICATION FOR PROCEDURE:  As stated in diagnosis.  She was felt healthy enough to undergo the surgery.  PROCEDURE:  Prophylactic antibiotics, satisfied general anesthesia, Foley catheter inserted.  Time-out performed.  Pneumatic tourniquet applied, and the leg was Esmarch out non-sterilely and tourniquet inflated to 250 mmHg.  This was done after the splint and cast placed in the emergency room and had been removed.  With the C-arm, I re-evaluated the fractures and was able to estimate the level of the syndesmosis screw and also the length needed for the fibular plate.  After prepping with DuraPrep from midcalf to toes, I made a curved incision over the anterior ankle medially and isolated the fracture site, which I cleared of soft tissue and I placed a guidepin up through the medial malleolus and to the tibia achieving and holding an anatomic reduction.  I then made a lateral incision and after evaluating the fracture, which had a component parallel to the floor, I used a single 3.5-mm intercalary screw, fully decorticated cortical screw vertically, which held the this portion of the fracture and I then used a 7-hole semitubular plate individually drilling, measuring, and screwing the  plate leaving the third hole from the bottom open, which I had selected because the guidepin placed across the ankle demonstrated it to be above the joint. After measuring this, I used a 45-mm fully corticated screw placing it across the syndesmosis, which nicely cinched it down.  I then went back and used a 45-mm cortical cancellous screw over the guidepin in the medial malleolus.  Final x-rays were taken AP and lateral projections and copies made with what appeared to be anatomic restoration of the ankle.  Both wounds were irrigated with sterile saline and closure performed with 2-0 Vicryl in the subcutaneous tissue and interrupted 4-0 nylon mattress sutures in the skin.  Betadine, Adaptic, dry sterile dressing, and a short leg splint, cast was applied.  She tolerated the procedure well and was taken to the PACU in satisfactory condition with no known complications.          ______________________________ Marlowe Kays, M.D.     JA/MEDQ  D:  11/07/2011  T:  11/08/2011  Job:  [id number]

## 2011-11-08 NOTE — Progress Notes (Signed)
Patient ID: Jillian [name], female   DOB: Jun 21, 1925, 78 y.o.   MRN: [id number] POD 1--alert and comfortable. NV intact.   She may have had a petit mal earlier today after iv dilaudid which I have discontinued.  Will begin PT.  SS has seen her.

## 2011-11-08 NOTE — Evaluation (Signed)
Physical Therapy Evaluation Patient Details Name: Jillian [name] MRN: [id number] DOB: [birth year] Today's Date: 11/08/2011 Time: 4696-2952 PT Time Calculation (min): 16 min  PT Assessment / Plan / Recommendation Clinical Impression  Pt s/p fall at home with L ankle ORIF. Pt mobility limited mostly by nausea and vomiting. Anticipate patient to be safe for d/c home when appropriate providing daughter able to provide 24/7 supervision due to patient spouse with alzheimers and patient is his primary caregiver.    PT Assessment  Patient needs continued PT services    Follow Up Recommendations  No PT follow up;Supervision/Assistance - 24 hour    Barriers to Discharge Decreased caregiver support      Equipment Recommendations  Rolling walker with 5" wheels    Recommendations for Other Services     Frequency Min 5X/week    Precautions / Restrictions Restrictions LLE Weight Bearing: Non weight bearing   Pertinent Vitals/Pain Pt unable to rate but reports discomfort in L LE      Mobility  Bed Mobility Bed Mobility: Supine to Sit Supine to Sit: 4: Min assist;HOB flat (due to nausea) Details for Bed Mobility Assistance: pt able to manage bilat LEs I'ly Transfers Transfers: Sit to Stand;Stand to Sit;Stand Pivot Transfers Sit to Stand: 4: Min assist;With upper extremity assist;From bed Stand to Sit: 4: Min assist;With armrests;With upper extremity assist;To chair/3-in-1 Stand Pivot Transfers: 4: Min assist (with RW) Details for Transfer Assistance: pt able to maintain L LE NWB during std pvt transfer. minA required for balance due to pt with dizziness. v/c's for walker management Ambulation/Gait Ambulation/Gait Assistance: Not tested (comment) (limited by nausea/vomitting and dizziness) Ambulation Distance (Feet):  (5 steps to chair)    Exercises     PT Diagnosis: Difficulty walking;Acute pain  PT Problem List: Decreased strength;Decreased range of motion;Decreased activity  tolerance;Decreased mobility;Decreased balance PT Treatment Interventions: DME instruction;Gait training;Stair training;Functional mobility training;Therapeutic activities;Therapeutic exercise   PT Goals Acute Rehab PT Goals PT Goal Formulation: With patient Time For Goal Achievement: 11/15/11 Potential to Achieve Goals: Good Pt will go Supine/Side to Sit: with modified independence;with HOB 0 degrees PT Goal: Supine/Side to Sit - Progress: Goal set today Pt will go Sit to Supine/Side: with modified independence;with HOB 0 degrees PT Goal: Sit to Supine/Side - Progress: Goal set today Pt will go Sit to Stand: with modified independence;with upper extremity assist (up to RW.) PT Goal: Sit to Stand - Progress: Goal set today Pt will Ambulate: 16 - 50 feet;with modified independence;with rolling walker PT Goal: Ambulate - Progress: Goal set today Pt will Go Up / Down Stairs: 6-9 stairs;with min assist;with rolling walker (or with Right rail and L HHA.) PT Goal: Up/Down Stairs - Progress: Goal set today  Visit Information  Last PT Received On: 11/08/11 Assistance Needed: +1    Subjective Data  Subjective: Pt received supine in bed with c/o nausea.   Prior Functioning  Home Living Lives With: Spouse (who has alzheimers) Available Help at Discharge: Family;Available 24 hours/day (pt reports daughter to be coming to stay with her) Type of Home: House Home Access: Stairs to enter [employer] of Steps: 5 Entrance Stairs-Rails: Right Home Layout: One level Prior Function Level of Independence: Independent Able to Take Stairs?: Yes Driving: Yes Vocation: Retired Musician: No difficulties Dominant Hand: Right    Cognition  Overall Cognitive Status: Appears within functional limits for tasks assessed/performed Arousal/Alertness: Awake/alert Orientation Level: Appears intact for tasks assessed Behavior During Session: [facility] for tasks performed  Extremity/Trunk Assessment Right Upper Extremity Assessment RUE ROM/Strength/Tone: Within functional levels Left Upper Extremity Assessment LUE ROM/Strength/Tone: Within functional levels Right Lower Extremity Assessment RLE ROM/Strength/Tone: Within functional levels Left Lower Extremity Assessment LLE ROM/Strength/Tone: WFL for tasks assessed (hip/knee WFL, ankle in splint) Trunk Assessment Trunk Assessment: Normal   Balance    End of Session PT - End of Session Equipment Utilized During Treatment: Gait belt Activity Tolerance:  (limited by vomitting) Patient left: in chair;with call bell/phone within reach;with nursing in room Nurse Communication: Mobility status (RN notified re: vomitting and dizziness)  GP     Marcene Brawn 11/08/2011, 12:14 PM  Lewis Shock, PT, DPT Pager #: [phone] Office #: [phone]

## 2011-11-08 NOTE — Progress Notes (Signed)
1 mg dilaudid given to pt due to lt hip pain at 1012.  At 1017, pt jerked her arms up and began staring straight ahead, then her eyes began moving slowly from side to side.  Pt would not respond to any stimuli for 20 seconds.  After episode pt could speak clearly and was neuro intact.  Vitals were 98.5, 126/67 hr 85.  Rapid response notified, Dr. Simonne Come notified who said to not give any more IV dilaudid, only oral pain med.

## 2011-11-09 MED ORDER — PANTOPRAZOLE SODIUM 40 MG PO TBEC
40.0000 mg | DELAYED_RELEASE_TABLET | Freq: Every day | ORAL | Status: DC
  Administered 2011-11-09: 40 mg via ORAL
  Filled 2011-11-09: qty 1

## 2011-11-09 MED ORDER — CITALOPRAM HYDROBROMIDE 10 MG PO TABS
10.0000 mg | ORAL_TABLET | Freq: Every day | ORAL | Status: DC
  Administered 2011-11-10: 10 mg via ORAL
  Filled 2011-11-09 (×2): qty 1

## 2011-11-09 NOTE — Progress Notes (Signed)
Patient ID: Jillian [name], female   DOB: Aug 14, 1925, 78 y.o.   MRN: [id number] Per pharmacy suggestion will start prilosec bid, resume celexa.  Per PT request will order rolling walker with 5" wheels'  Possibly home this weekend.

## 2011-11-09 NOTE — Progress Notes (Signed)
Spoke today with daughter- Ms. Thayer Jew. She confirmed d/c home when stable with HH and DME.  Notified Vance Peper- RNCM of above.  Daughter from [place] will be staying with patient.  No further CSW needs identified. CSW signing off but will be available if needed.  Lorri Frederick. [place]  [phone]

## 2011-11-09 NOTE — Progress Notes (Signed)
CARE MANAGEMENT NOTE 11/09/2011  Patient:  Jillian [name], Jillian [name]   Account Number:  [id number]  Date Initiated:  11/09/2011  Documentation initiated by:  Vance Peper  Subjective/Objective Assessment:   27 yr. old female s/p left tib/fib ORIF     Action/Plan:   CM spoke with patient and daughter regarding home health and DME needs. Choice offered. Patient's daughter will arrive from [place] in the morning to assist with her care at discharge.   Anticipated DC Date:  11/10/2011   Anticipated DC Plan:  HOME W HOME HEALTH SERVICES      DC Planning Services  CM consult      [facility] Choice  HOME HEALTH  DURABLE MEDICAL EQUIPMENT   Choice offered to / List presented to:  C-4 Adult Children   DME arranged  WHEELCHAIR - MANUAL  WALKER - ROLLING      DME agency  [facility]     HH arranged  HH-2 PT      Status of service:  In process, will continue to follow Medicare Important Message given?   (If response is "NO", the following Medicare IM given date fields will be blank) Date Medicare IM given:   Date Additional Medicare IM given:    Discharge Disposition:  HOME W HOME HEALTH SERVICES  Per UR Regulation:    If discussed at Long Length of Stay Meetings, dates discussed:    Comments:  11/09/11 - 1430 Vance Peper, RN BSN Case Manager Patient has [employer] Medicare, CM trying to locate [facility] agency that accepts. have faxed info to [employer], waiting to see if they have to charge a co-pay for services.

## 2011-11-09 NOTE — Progress Notes (Signed)
Patient found sitting on floor of the bathroom. Patient states that "she fell on her way back to the recliner." Patient was alert and oriented x 3 and was aware that she should have used the call light as instructed. Doctor was aware of fall and present at the time she was being helped.  Mauro Kaufmann, RN

## 2011-11-09 NOTE — Progress Notes (Signed)
Physical Therapy Treatment Note   11/09/11 1200  PT Visit Information  Last PT Received On 11/09/11  Assistance Needed +1  PT Time Calculation  PT Start Time 1200  PT Stop Time 1228  PT Time Calculation (min) 28 min  Subjective Data  Subjective Pt received sitting up in chair with c/o L LE pain but unable to rate.  Patient Stated Goal home  Precautions  Precautions Fall  Restrictions  LLE Weight Bearing NWB  Cognition  Overall Cognitive Status Appears within functional limits for tasks assessed/performed  Transfers  Transfers Sit to Stand;Stand to Sit  Sit to Stand 4: Min assist;With upper extremity assist;From bed  Stand to Sit 4: Min assist;With armrests;With upper extremity assist;To chair/3-in-1  Details for Transfer Assistance pt remains to require maxA v/c's for hand placement and safety  Ambulation/Gait  Ambulation/Gait Assistance 4: Min assist  Ambulation Distance (Feet) 20 Feet  Assistive device Rolling walker  Ambulation/Gait Assistance Details v/c's for walker mangement  Gait Pattern Step-to pattern  Gait velocity slow  General Gait Details labored effort  Stairs Yes  Stairs Assistance 3: Mod assist  Stairs Assistance Details (indicate cue type and reason) pt with increased difficulty clearing 1 step backwards with mod A. pt unsafe to "hop" up 5 steps to enter home. Instructed patient safest entry would be to be bumped up in w/c by 2 people. Handout provided. Patient reports daughter to fly in tomorrow AM.  Stair Management Technique No rails;Backwards;With walker  Number of Stairs 1   PT - End of Session  Equipment Utilized During Treatment Gait belt  Activity Tolerance Patient limited by fatigue  Patient left in chair;with call bell/phone within reach;with nursing in room  Nurse Communication Mobility status  PT - Assessment/Plan  Comments on Treatment Session Spoke with social work. Family is prepared to take patient home with spouse who has alzheimers and  provide 24/7 supervision/assist. Patient to require w/c as primary mode of mobilty due to limited ability to hop long distances due to age and increase risk of overuse injury to bilat shoulders and R LE. Social work aware.  PT Plan Discharge plan remains appropriate;Frequency remains appropriate  PT Frequency Min 5X/week  Follow Up Recommendations Home health PT;Supervision/Assistance - 24 hour  Equipment Recommended Rolling walker with 5" wheels;Wheelchair (measurements) (youth w/c with elevated leg rests and youth RW)  Acute Rehab PT Goals  Time For Goal Achievement 11/15/11  Potential to Achieve Goals Good  PT Goal: Sit to Stand - Progress Progressing toward goal  PT Goal: Ambulate - Progress Progressing toward goal  PT Goal: Up/Down Stairs - Progress Progressing toward goal  PT General Charges  $$ ACUTE PT VISIT 1 Procedure  PT Treatments  $Gait Training 23-37 mins    Pain: pt unable to rate but reports of pain in L ankle  Lewis Shock, PT, DPT Pager #: [phone] Office #: [phone]

## 2011-11-09 NOTE — Progress Notes (Signed)
CARE MANAGEMENT NOTE 11/09/2011  Patient:  Jillian [name], Jillian [name]   Account Number:  [id number]  Date Initiated:  11/09/2011  Documentation initiated by:  Vance Peper  Subjective/Objective Assessment:   85 yr. old female s/p left tib/fib ORIF     Action/Plan:   CM spoke with patient and daughter regarding home health and DME needs. Choice offered. Patient's daughter will arrive from [place] in the morning to assist with her care at discharge.   Anticipated DC Date:  11/10/2011   Anticipated DC Plan:  HOME W HOME HEALTH SERVICES      DC Planning Services  CM consult      [facility] Choice  HOME HEALTH  DURABLE MEDICAL EQUIPMENT   Choice offered to / List presented to:  C-4 Adult Children   DME arranged  WHEELCHAIR - MANUAL  WALKER - ROLLING      DME agency  [facility]     HH arranged  HH-2 PT      [facility] agency  [facility]   Status of service:  Completed, signed off Medicare Important Message given?   (If response is "NO", the following Medicare IM given date fields will be blank) Date Medicare IM given:   Date Additional Medicare IM given:    Discharge Disposition:  HOME W HOME HEALTH SERVICES  Per UR Regulation:    If discussed at Long Length of Stay Meetings, dates discussed:    Comments:  11/09/11 - 1430 Vance Peper, RN BSN Case Manager Patient has [employer] Medicare, CM trying to locate [facility] agency that accepts. have faxed info to [employer], waiting to see if they have to charge a co-pay for services.

## 2011-11-10 MED ORDER — METHOCARBAMOL 500 MG PO TABS: 500.0000 mg | ORAL_TABLET | Freq: Four times a day (QID) | ORAL | Status: AC

## 2011-11-10 MED ORDER — OXYCODONE-ACETAMINOPHEN 5-325 MG PO TABS: 1.0000 | ORAL_TABLET | ORAL | Status: AC | PRN

## 2011-11-10 MED ORDER — RIVAROXABAN 10 MG PO TABS: 10.0000 mg | ORAL_TABLET | Freq: Every day | ORAL | Status: DC

## 2011-11-10 NOTE — Progress Notes (Signed)
11/10/2011 1244 Contacted [facility] for 3n1 to be delivered to home. Pt states she has her other DME in room. Isidoro Donning RN CCM Case Mgmt phone [phone]

## 2011-11-10 NOTE — Progress Notes (Signed)
Physical Therapy Treatment Patient Details Name: HOA BRIGGS MRN: [id number] DOB: [birth year] Today's Date: 11/10/2011 Time: 1125-1205 PT Time Calculation (min): 40 min  PT Assessment / Plan / Recommendation Comments on Treatment Session  Family present during treatment and able to return demonstrate how to assist pt with mobility.  Family feels pt would benefit from [facility] upon d/c and RN alerted.  Recommend HHPT to continue to work with pt on mobility and strengthening.    Follow Up Recommendations  Home health PT;Supervision/Assistance - 24 hour    Barriers to Discharge        Equipment Recommendations  Rolling walker with 5" wheels;Wheelchair (measurements);3 in 1 bedside comode    Recommendations for Other Services    Frequency Min 5X/week   Plan Discharge plan remains appropriate;Frequency remains appropriate    Precautions / Restrictions Precautions Precautions: Fall Restrictions Weight Bearing Restrictions: Yes LLE Weight Bearing: Non weight bearing   Pertinent Vitals/Pain No c/o pain.    Mobility  Bed Mobility Bed Mobility: Not assessed Transfers Transfers: Sit to Stand;Stand to Sit Sit to Stand: 4: Min assist;With upper extremity assist;With armrests;From chair/3-in-1 Stand to Sit: 4: Min assist;With upper extremity assist;With armrests;To chair/3-in-1 Stand Pivot Transfers: 4: Min assist Details for Transfer Assistance: Requires min assist to balance/steady self and cues for safety and technique Ambulation/Gait Ambulation/Gait Assistance: 4: Min assist Ambulation Distance (Feet): 6 Feet Assistive device: Rolling walker Ambulation/Gait Assistance Details: min assist to steady and cues for safety and walker management General Gait Details: able to maintain NWB LLE Stairs: Yes Stairs Assistance: 1: +2 Total assist Stairs Assistance Details (indicate cue type and reason): Educated pts daughter on up/down multiple steps by bumping w/c.  Daughter able to return  demonstrate. Stair Management Technique: Backwards;Wheelchair Number of Stairs: 2  Corporate treasurer: Yes Wheelchair Assistance: 5: Financial planner Details (indicate cue type and reason): Educated on w/c parts.  Cues for turning w/c secondary to first time with w/c mobility Wheelchair Propulsion: Both upper extremities Wheelchair Parts Management: Needs assistance Distance: 100      PT Goals Acute Rehab PT Goals Time For Goal Achievement: 11/15/11 Potential to Achieve Goals: Good PT Goal: Sit to Stand - Progress: Progressing toward goal PT Goal: Ambulate - Progress: Progressing toward goal  Visit Information  Last PT Received On: 11/10/11 Assistance Needed: +1    Subjective Data      Cognition  Arousal/Alertness: Awake/alert Orientation Level: Appears intact for tasks assessed Behavior During Session: [facility] for tasks performed    Balance     End of Session PT - End of Session Equipment Utilized During Treatment: Gait belt Activity Tolerance: Patient tolerated treatment well Patient left: in chair;with call bell/phone within reach;with family/visitor present Nurse Communication: Mobility status;Other (comment) (Family requesting BSC)   Newell Coral 11/10/2011, 12:34 PM  Newell Coral, PTA Acute Rehab [phone] (office)

## 2011-11-10 NOTE — Progress Notes (Signed)
Subjective: 3 Days Post-Op Procedure(s) (LRB): OPEN REDUCTION INTERNAL FIXATION (ORIF) ANKLE FRACTURE (Left) Patient reports pain as mild.  Wants to go home today  Objective: Vital signs in last 24 hours: Temp:  [98 F (36.7 C)-98.6 F (37 C)] 98.6 F (37 C) (07/13 0551) Pulse Rate:  [65-83] 65  (07/13 0551) Resp:  [18-20] 18  (07/13 0551) BP: (126-151)/(71-88) 146/88 mmHg (07/13 0551) SpO2:  [96 %-98 %] 98 % (07/13 0551)  Intake/Output from previous day: 07/12 0701 - 07/13 0700 In: 250 [P.O.:250] Out: -  Intake/Output this shift:    No results found for this basename: HGB:5 in the last 72 hours No results found for this basename: WBC:2,RBC:2,HCT:2,PLT:2 in the last 72 hours No results found for this basename: NA:2,K:2,CL:2,CO2:2,BUN:2,CREATININE:2,GLUCOSE:2,CALCIUM:2 in the last 72 hours No results found for this basename: LABPT:2,INR:2 in the last 72 hours  Neurologically intact Incision: no drainage Compartment soft  Assessment/Plan: 3 Days Post-Op Procedure(s) (LRB): OPEN REDUCTION INTERNAL FIXATION (ORIF) ANKLE FRACTURE (Left) Up with therapy Discharge home with home health  Landis Cassaro V 11/10/2011, 10:57 AM

## 2011-11-14 NOTE — Care Management (Signed)
11/10/11 7:00 pm Encarnacion Slates RNC BSN [phone]- on call RN received call from 'Jacky' RN on unit from [facility]  stating that the patient called back to  unit after discharge stating that patient had never received her 3n1 DME from [facility].  Nurse Care Manager called [facility] and spoke to Connell at [facility] regarding DME and she stated that the equpiment would be delivered on Sunday 11/11/11 0800 to patient. Nurse care manager called patient at her home [phone] and notified her of that via telephone. Patient verbalized understanding. Instructed patient to call nurse care manager on call tomorrow if she did not recieve equipment and # given to patient.

## 2011-11-15 NOTE — Progress Notes (Signed)
Discharge summary dictated on 11/16/2011,  #[id number].

## 2011-11-16 NOTE — Discharge Summary (Signed)
NAMEBURNIE, HANK NO.:  [id number]  MEDICAL RECORD NO.:  [id number]  LOCATION:  5N27C                        FACILITY:  [facility]  PHYSICIAN:  Marlowe Kays, M.D.  DATE OF BIRTH:  May 09, 1925  DATE OF ADMISSION:  11/06/2011 DATE OF DISCHARGE:  11/10/2011                              DISCHARGE SUMMARY   ADMISSION DIAGNOSES: 1. Osteoporosis. 2. Ankle fracture bimalleolar, closed.  DISCHARGE DIAGNOSES: 1. Osteoporosis. 2. Ankle fracture bimalleolar, closed.  OPERATION:  On November 07, 2011, the patient underwent open reduction and internal fixation, closed bimalleolar fracture with syndesmosis widening left ankle.  BRIEF HISTORY:  This patient missed a step at home when she was feeding her cat and came down onto her left ankle.  She had immediate pain in the ankle and flexor deformity.  She could not put any weight on the ankle.  She eventually had x-rays and displaced bimalleolar fracture with widening of the syndesmosis was seen.  Due to the instability of the fracture and the fact that this is a very active lady, it was felt she would benefit with surgical intervention and was admitted for the above procedure.  COURSE IN THE HOSPITAL:  The patient tolerated the surgical procedure quite well.  Worked with physical therapy for nonweightbearing on the operative extremity.  She needed to have home health, and this was discussed.  She is to receive help from her family when she does go home.  She was fitted with a walker prior to discharge.  Vital signs were stable at the time of discharge.  Neurovascular was intact to the toe on the left, and splint/cast was intact.  She will return to see [place] in 2 weeks after date of surgery, nonweightbearing to the operative site, analgesics were given.  She is to continue with her home medications which is a vitamin D, Lexapro 5 mg tablet, Prilosec 20 mg capsule, and Evista 60 mg tablet.  Dr. Simonne Come put her on  Xarelto, postoperatively for prevention of DVT.  She is to resume with her regular diet at home.     Sydny Schnitzler L. Cherlynn June.   ______________________________ Marlowe Kays, M.D.   DLU/MEDQ  D:  11/15/2011  T:  11/16/2011  Job:  [id number]

## 2011-12-19 ENCOUNTER — Other Ambulatory Visit: Payer: Self-pay | Admitting: Orthopedic Surgery

## 2012-01-28 ENCOUNTER — Encounter (HOSPITAL_BASED_OUTPATIENT_CLINIC_OR_DEPARTMENT_OTHER): Payer: Self-pay | Admitting: *Deleted

## 2012-01-28 NOTE — Progress Notes (Signed)
To  [facility] at 1030- Hg on arrival,Ekg in epic,Npo after mn-to take prilosec,lexapro w/small amt water that am.

## 2012-02-01 ENCOUNTER — Ambulatory Visit (HOSPITAL_BASED_OUTPATIENT_CLINIC_OR_DEPARTMENT_OTHER): Payer: Medicare Other | Admitting: Anesthesiology

## 2012-02-01 ENCOUNTER — Encounter (HOSPITAL_BASED_OUTPATIENT_CLINIC_OR_DEPARTMENT_OTHER)
Admission: RE | Disposition: A | Discharge status: Home / Self Care | Payer: Self-pay | Source: Ambulatory Visit | Attending: Orthopedic Surgery

## 2012-02-01 ENCOUNTER — Ambulatory Visit (HOSPITAL_BASED_OUTPATIENT_CLINIC_OR_DEPARTMENT_OTHER)
Admission: RE | Admit: 2012-02-01 | Discharge: 2012-02-01 | Disposition: A | Discharge status: Home / Self Care | Payer: Medicare Other | Source: Ambulatory Visit | Attending: Orthopedic Surgery | Admitting: Orthopedic Surgery

## 2012-02-01 ENCOUNTER — Encounter (HOSPITAL_BASED_OUTPATIENT_CLINIC_OR_DEPARTMENT_OTHER): Payer: Self-pay | Admitting: Anesthesiology

## 2012-02-01 ENCOUNTER — Encounter (HOSPITAL_BASED_OUTPATIENT_CLINIC_OR_DEPARTMENT_OTHER): Payer: Self-pay

## 2012-02-01 DIAGNOSIS — Z8781 Personal history of (healed) traumatic fracture: Secondary | ICD-10-CM

## 2012-02-01 DIAGNOSIS — M810 Age-related osteoporosis without current pathological fracture: Secondary | ICD-10-CM | POA: Insufficient documentation

## 2012-02-01 DIAGNOSIS — K219 Gastro-esophageal reflux disease without esophagitis: Secondary | ICD-10-CM | POA: Insufficient documentation

## 2012-02-01 DIAGNOSIS — Z472 Encounter for removal of internal fixation device: Secondary | ICD-10-CM | POA: Insufficient documentation

## 2012-02-01 HISTORY — PX: HARDWARE REMOVAL: SHX979

## 2012-02-01 LAB — POCT HEMOGLOBIN-HEMACUE: Hemoglobin: 13.3 g/dL (ref 12.0–15.0)

## 2012-02-01 SURGERY — REMOVAL, HARDWARE
Anesthesia: General | Site: Ankle | Laterality: Left | Wound class: Clean

## 2012-02-01 MED ORDER — FENTANYL CITRATE 0.05 MG/ML IJ SOLN
25.0000 ug | INTRAMUSCULAR | Status: DC | PRN
  Administered 2012-02-01 (×2): 25 ug via INTRAVENOUS

## 2012-02-01 MED ORDER — HYDROCODONE-ACETAMINOPHEN 5-325 MG PO TABS: 1.0000 | ORAL_TABLET | Freq: Four times a day (QID) | ORAL | Status: DC | PRN

## 2012-02-01 MED ORDER — BUPIVACAINE-EPINEPHRINE PF 0.5-1:200000 % IJ SOLN
INTRAMUSCULAR | Status: DC | PRN
  Administered 2012-02-01: 3 mL

## 2012-02-01 MED ORDER — LACTATED RINGERS IV SOLN: INTRAVENOUS | Status: DC

## 2012-02-01 MED ORDER — FENTANYL CITRATE 0.05 MG/ML IJ SOLN
INTRAMUSCULAR | Status: DC | PRN
  Administered 2012-02-01 (×3): 25 ug via INTRAVENOUS

## 2012-02-01 MED ORDER — POVIDONE-IODINE 7.5 % EX SOLN: Freq: Once | CUTANEOUS | Status: DC

## 2012-02-01 MED ORDER — LIDOCAINE HCL (CARDIAC) 20 MG/ML IV SOLN
INTRAVENOUS | Status: DC | PRN
  Administered 2012-02-01: 60 mg via INTRAVENOUS

## 2012-02-01 MED ORDER — LACTATED RINGERS IV SOLN
INTRAVENOUS | Status: DC
  Administered 2012-02-01 (×3): via INTRAVENOUS

## 2012-02-01 MED ORDER — PROPOFOL 10 MG/ML IV BOLUS
INTRAVENOUS | Status: DC | PRN
  Administered 2012-02-01: 120 mg via INTRAVENOUS

## 2012-02-01 SURGICAL SUPPLY — 60 items
BANDAGE CONFORM 3  STR LF (GAUZE/BANDAGES/DRESSINGS) IMPLANT
BANDAGE ELASTIC 3 VELCRO ST LF (GAUZE/BANDAGES/DRESSINGS) IMPLANT
BANDAGE ELASTIC 4 VELCRO ST LF (GAUZE/BANDAGES/DRESSINGS) IMPLANT
BANDAGE ELASTIC 6 VELCRO ST LF (GAUZE/BANDAGES/DRESSINGS) ×2 IMPLANT
BANDAGE ESMARK 6X9 LF (GAUZE/BANDAGES/DRESSINGS) ×1 IMPLANT
BANDAGE GAUZE ELAST BULKY 4 IN (GAUZE/BANDAGES/DRESSINGS) IMPLANT
BENZOIN TINCTURE PRP APPL 2/3 (GAUZE/BANDAGES/DRESSINGS) IMPLANT
BLADE SURG 15 STRL LF DISP TIS (BLADE) ×2 IMPLANT
BLADE SURG 15 STRL SS (BLADE) ×2
BNDG ESMARK 4X9 LF (GAUZE/BANDAGES/DRESSINGS) IMPLANT
BNDG ESMARK 6X9 LF (GAUZE/BANDAGES/DRESSINGS) ×2
CANISTER SUCTION 1200CC (MISCELLANEOUS) IMPLANT
CANISTER SUCTION 2500CC (MISCELLANEOUS) IMPLANT
CLOTH BEACON ORANGE TIMEOUT ST (SAFETY) ×2 IMPLANT
COVER TABLE BACK 60X90 (DRAPES) ×2 IMPLANT
DRAPE C-ARM 42X72 X-RAY (DRAPES) ×2 IMPLANT
DRAPE C-ARM MINI 42X72 WSTRAPS (DRAPES) IMPLANT
DRAPE EXTREMITY T 121X128X90 (DRAPE) ×2 IMPLANT
DRAPE LG THREE QUARTER DISP (DRAPES) ×4 IMPLANT
DRAPE U-SHAPE 47X51 STRL (DRAPES) ×2 IMPLANT
DRSG EMULSION OIL 3X3 NADH (GAUZE/BANDAGES/DRESSINGS) ×2 IMPLANT
DURAPREP 26ML APPLICATOR (WOUND CARE) ×2 IMPLANT
ELECT NEEDLE TIP 2.8 STRL (NEEDLE) IMPLANT
ELECT REM PT RETURN 9FT ADLT (ELECTROSURGICAL) ×2
ELECTRODE REM PT RTRN 9FT ADLT (ELECTROSURGICAL) ×1 IMPLANT
GLOVE BIOGEL PI IND STRL 8 (GLOVE) ×1 IMPLANT
GLOVE BIOGEL PI INDICATOR 8 (GLOVE) ×1
GLOVE ECLIPSE 8.0 STRL XLNG CF (GLOVE) ×6 IMPLANT
GLOVE INDICATOR 8.0 STRL GRN (GLOVE) ×4 IMPLANT
GOWN W/2 COTTON TOWELS 2 STD (GOWNS) ×2 IMPLANT
GOWN XL W/COTTON TOWEL STD (GOWNS) ×2 IMPLANT
NEEDLE HYPO 22GX1.5 SAFETY (NEEDLE) ×2 IMPLANT
NEEDLE HYPO 25X1 1.5 SAFETY (NEEDLE) IMPLANT
NS IRRIG 500ML POUR BTL (IV SOLUTION) ×2 IMPLANT
PACK BASIN DAY SURGERY FS (CUSTOM PROCEDURE TRAY) ×2 IMPLANT
PAD CAST 3X4 CTTN HI CHSV (CAST SUPPLIES) IMPLANT
PADDING CAST ABS 4INX4YD NS (CAST SUPPLIES) ×1
PADDING CAST ABS COTTON 4X4 ST (CAST SUPPLIES) ×1 IMPLANT
PADDING CAST COTTON 3X4 STRL (CAST SUPPLIES)
PADDING CAST COTTON 6X4 STRL (CAST SUPPLIES) IMPLANT
PENCIL BUTTON HOLSTER BLD 10FT (ELECTRODE) ×2 IMPLANT
SPONGE GAUZE 4X4 12PLY (GAUZE/BANDAGES/DRESSINGS) ×2 IMPLANT
STOCKINETTE 4X48 STRL (DRAPES) ×2 IMPLANT
STOCKINETTE 6  STRL (DRAPES)
STOCKINETTE 6 STRL (DRAPES) IMPLANT
STRIP CLOSURE SKIN 1/2X4 (GAUZE/BANDAGES/DRESSINGS) IMPLANT
SUCTION FRAZIER TIP 10 FR DISP (SUCTIONS) IMPLANT
SUT ETHILON 4 0 PS 2 18 (SUTURE) IMPLANT
SUT MON AB 4-0 PC3 18 (SUTURE) IMPLANT
SUT VIC AB 2-0 UR6 27 (SUTURE) IMPLANT
SUT VIC AB 3-0 FS2 27 (SUTURE) IMPLANT
SUT VIC AB 4-0 SH 27 (SUTURE)
SUT VIC AB 4-0 SH 27XANBCTRL (SUTURE) IMPLANT
SYR BULB IRRIGATION 50ML (SYRINGE) ×2 IMPLANT
SYR CONTROL 10ML LL (SYRINGE) ×2 IMPLANT
TOWEL OR 17X24 6PK STRL BLUE (TOWEL DISPOSABLE) ×4 IMPLANT
TUBE CONNECTING 12X1/4 (SUCTIONS) IMPLANT
UNDERPAD 30X30 INCONTINENT (UNDERPADS AND DIAPERS) ×2 IMPLANT
WATER STERILE IRR 500ML POUR (IV SOLUTION) ×2 IMPLANT
YANKAUER SUCT BULB TIP NO VENT (SUCTIONS) IMPLANT

## 2012-02-01 NOTE — Brief Op Note (Signed)
02/01/2012  2:07 PM  PATIENT:  Jillian [name]  78 y.o. female  PRE-OPERATIVE DIAGNOSIS:  RETAINED SYNDOMOSIS SCREW OF THE LEFT ANKLE  POST-OPERATIVE DIAGNOSIS:  RETAINED SYNDOMOSIS SCREW OF LEFT ANKLE  PROCEDURE:  Procedure(s) (LRB) with comments: HARDWARE REMOVAL (Left) - MIN C-ARM  left ankle hardware removal   SURGEON:  Surgeon(s) and Role:    * Drucilla Schmidt, MD - Primary  PHYSICIAN ASSISTANT:   ASSISTANTS: nurse}   ANESTHESIA:   general  EBL:     BLOOD ADMINISTERED:none  DRAINS: none   LOCAL MEDICATIONS USED:  MARCAINE     SPECIMEN:  Source of Specimen:  screw from left ankle  DISPOSITION OF SPECIMEN:  N/A  COUNTS:  YES  TOURNIQUET:   Total Tourniquet Time Documented: Thigh (Left) - 32 minutes  DICTATION: .Other Dictation: Dictation Number [id number]  PLAN OF CARE: Discharge to home after PACU  PATIENT DISPOSITION:  PACU - hemodynamically stable.   Delay start of Pharmacological VTE agent (>24hrs) due to surgical blood loss or risk of bleeding: not applicable

## 2012-02-01 NOTE — Anesthesia Postprocedure Evaluation (Signed)
  Anesthesia Post-op Note  Patient: Jillian [name]  Procedure(s) Performed: Procedure(s) (LRB): HARDWARE REMOVAL (Left)  Patient Location: PACU  Anesthesia Type: General  Level of Consciousness: awake and alert   Airway and Oxygen Therapy: Patient Spontanous Breathing  Post-op Pain: mild  Post-op Assessment: Post-op Vital signs reviewed, Patient's Cardiovascular Status Stable, Respiratory Function Stable, Patent Airway and No signs of Nausea or vomiting  Post-op Vital Signs: stable  Complications: No apparent anesthesia complications

## 2012-02-01 NOTE — H&P (Signed)
Jillian [name] is an 78 y.o. female.   Chief Complaint: retained syndesmosis screw lt ankle HPIpost orif bimalleolar ankle fx with widening of mortis.  Fractures have healed.  Past Medical History  Diagnosis Date  . Osteoporosis   . Ankle fracture, bimalleolar, closed     Past Surgical History  Procedure Date  . Hemorrhoid surgery   . Fracture surgery   . Tear duct probing 03/2011  . Orif ankle fracture 11/07/2011    Procedure: OPEN REDUCTION INTERNAL FIXATION (ORIF) ANKLE FRACTURE;  Surgeon: Drucilla Schmidt, MD;  Location: [facility] OR;  Service: Orthopedics;  Laterality: Left;    History reviewed. No pertinent family history. Social History:  reports that she has never smoked. She does not have any smokeless tobacco history on file. She reports that she does not drink alcohol or use illicit drugs.  Allergies:  Allergies  Allergen Reactions  . Doxycycline Nausea And Vomiting  . Aspirin Other (See Comments)    GI upset  . Ibuprofen Other (See Comments)    GI bleed precautions  . Sulfa Antibiotics Hives    Medications Prior to Admission  Medication Sig Dispense Refill  . calcium carbonate (OS-CAL) 600 MG TABS Take 600 mg by mouth 2 (two) times daily with a meal.      . cholecalciferol (VITAMIN D) 1000 UNITS tablet Take 1,000 Units by mouth daily.      Marland Kitchen denosumab (PROLIA) 60 MG/ML SOLN injection Inject 60 mg into the skin every 6 (six) months. Administer in upper arm, thigh, or abdomen      . escitalopram (LEXAPRO) 5 MG tablet Take 5 mg by mouth daily.      Marland Kitchen omeprazole (PRILOSEC) 20 MG capsule Take 20 mg by mouth daily.      . rivaroxaban (XARELTO) 10 MG TABS tablet Take 1 tablet (10 mg total) by mouth daily.  7 tablet  0    Results for orders placed during the hospital encounter of 02/01/12 (from the past 48 hour(s))  POCT HEMOGLOBIN-HEMACUE     Status: Normal   Collection Time   02/01/12 11:45 AM      Component Value Range Comment   Hemoglobin 13.3  12.0 - 15.0 g/dL    No  results found.  ROS  Blood pressure 134/79, pulse 53, temperature 97 F (36.1 C), temperature source Oral, resp. rate 16, height 5\' 2"  (1.575 m), weight 59.421 kg (131 lb), SpO2 100.00%. Physical Exam  Constitutional: She is oriented to person, place, and time. She appears well-developed and well-nourished.  HENT:  Head: Normocephalic and atraumatic.  Right Ear: External ear normal.  Left Ear: External ear normal.  Nose: Nose normal.  Mouth/Throat: Oropharynx is clear and moist.  Eyes: Conjunctivae normal and EOM are normal. Pupils are equal, round, and reactive to light.  Neck: Normal range of motion. Neck supple.  Cardiovascular: Normal rate, regular rhythm, normal heart sounds and intact distal pulses.   Respiratory: Effort normal and breath sounds normal.  GI: Soft. Bowel sounds are normal.  Musculoskeletal: Normal range of motion.  Neurological: She is alert and oriented to person, place, and time. She has normal reflexes.  Skin: Skin is warm and dry.  Psychiatric: She has a normal mood and affect. Her behavior is normal. Judgment and thought content normal.     Assessment/Plan Retained syndesmosis screw lt ankle for removal to enable full weight bearing  Jillian [name] P 02/01/2012, 1:10 PM

## 2012-02-01 NOTE — Anesthesia Preprocedure Evaluation (Addendum)
Anesthesia Evaluation  Patient identified by MRN, date of birth, ID band Patient awake    Reviewed: Allergy & Precautions, H&P , NPO status , Patient's Chart, lab work & pertinent test results  Airway Mallampati: II TM Distance: >3 FB Neck ROM: full    Dental  (+) Dental Advisory Given and Caps,    Pulmonary neg pulmonary ROS,  breath sounds clear to auscultation  Pulmonary exam normal       Cardiovascular Exercise Tolerance: Good negative cardio ROS  Rhythm:regular Rate:Normal     Neuro/Psych negative neurological ROS  negative psych ROS   GI/Hepatic negative GI ROS, Neg liver ROS, GERD-  Medicated and Controlled,  Endo/Other  negative endocrine ROS  Renal/GU negative Renal ROS  negative genitourinary   Musculoskeletal   Abdominal   Peds  Hematology negative hematology ROS (+)   Anesthesia Other Findings   Reproductive/Obstetrics negative OB ROS                          Anesthesia Physical Anesthesia Plan  ASA: II  Anesthesia Plan: General   Post-op Pain Management:    Induction: Intravenous  Airway Management Planned: LMA  Additional Equipment:   Intra-op Plan:   Post-operative Plan:   Informed Consent: I have reviewed the patients History and Physical, chart, labs and discussed the procedure including the risks, benefits and alternatives for the proposed anesthesia with the patient or authorized representative who has indicated his/her understanding and acceptance.   Dental Advisory Given  Plan Discussed with: CRNA and Surgeon  Anesthesia Plan Comments:        Anesthesia Quick Evaluation

## 2012-02-01 NOTE — Transfer of Care (Signed)
Immediate Anesthesia Transfer of Care Note  Patient: Jillian [name]  Procedure(s) Performed: Procedure(s) (LRB): HARDWARE REMOVAL (Left)  Patient Location: Patient transported to PACU with oxygen via face mask at 4 Liters / Min  Anesthesia Type: General  Level of Consciousness: awake and alert   Airway & Oxygen Therapy: Patient Spontanous Breathing and Patient connected to face mask oxygen  Post-op Assessment: Report given to PACU RN and Post -op Vital signs reviewed and stable  Post vital signs: Reviewed and stable  Dentition: Teeth and oropharynx remain in pre-op condition  Complications: No apparent anesthesia complications

## 2012-02-01 NOTE — Anesthesia Procedure Notes (Signed)
Procedure Name: LMA Insertion Date/Time: 02/01/2012 1:22 PM Performed by: Fran Lowes Pre-anesthesia Checklist: Patient identified, Emergency Drugs available, Suction available and Patient being monitored Patient Re-evaluated:Patient Re-evaluated prior to inductionOxygen Delivery Method: Circle System Utilized Preoxygenation: Pre-oxygenation with 100% oxygen Intubation Type: IV induction Ventilation: Mask ventilation without difficulty LMA: LMA inserted LMA Size: 4.0 Number of attempts: 1 Airway Equipment and Method: bite block Placement Confirmation: positive ETCO2 Tube secured with: Tape Dental Injury: Teeth and Oropharynx as per pre-operative assessment

## 2012-02-02 NOTE — Op Note (Deleted)
NAME:  Jillian [name], Jillian [name]                 ACCOUNT NO.:  [id number]  MEDICAL RECORD NO.:  [id number]  LOCATION:  5N27C                         FACILITY:  [facility]  PHYSICIAN:  Broc Caspers, M.D.  DATE OF BIRTH:  11/01/1925  DATE OF PROCEDURE:  02/01/2012 DATE OF DISCHARGE:  02/01/2012                              OPERATIVE REPORT   PREOPERATIVE DIAGNOSIS:  Retained syndesmosis screw, left ankle status post open reduction and internal fixation for bimalleolar fracture  POSTOPERATIVE DIAGNOSIS:  Retained syndesmosis screw, left ankle status post open reduction and internal fixation for bimalleolar fracture.  OPERATION:  Removal of syndesmosis screw, left ankle.  SURGEON:  Menna Abeln, M.D.  ASSISTANT:  Nurse.  ANESTHESIA:  General.  PATHOLOGY/JUSTIFICATION FOR PROCEDURE:  The original surgery was roughly 3 months ago.  Fractures are healed.  It is time for removal of the syndesmosis screws, so she can begin full weightbearing.  PROCEDURE:  Satisfactory general anesthesia, pneumatic tourniquet with the left leg Esmarch out nonsterilely and tourniquet inflated to 250 mmHg.  The left leg was then prepped with DuraPrep from midcalf to toes and draped in sterile field.  Using the mini C-arm, I was able to localize the level of the syndesmosis screw and I made incision over that locating the screw confirming it with a screwdriver and the screw. I then removed the screw without complication and it will be safe for the patient.  Soft tissue was infiltrated with Marcaine with adrenaline and the wound closed in layers with interrupted 3-0 Vicryl subcutaneous tissue, and interrupted 4-0 nylon mattress sutures in the skin. Betadine, Adaptic, dry sterile dressing were applied.  Tourniquet was released.  At the time of this dictation, she was on her way to recovery room in satisfactory condition with no known complications.          ______________________________ Thaily Hackworth,  M.D.     JA/MEDQ  D:  02/01/2012  T:  02/02/2012  Job:  [id number] 

## 2012-02-02 NOTE — Op Note (Signed)
Jillian [name], Jillian [name]                 ACCOUNT NO.:  [id number]  MEDICAL RECORD NO.:  [id number]  LOCATION:  5N27C                         FACILITY:  [facility]  PHYSICIAN:  Marlowe Kays, M.D.  DATE OF BIRTH:  06-02-1925  DATE OF PROCEDURE:  02/01/2012 DATE OF DISCHARGE:  02/01/2012                              OPERATIVE REPORT   PREOPERATIVE DIAGNOSIS:  Retained syndesmosis screw, left ankle status post open reduction and internal fixation for bimalleolar fracture  POSTOPERATIVE DIAGNOSIS:  Retained syndesmosis screw, left ankle status post open reduction and internal fixation for bimalleolar fracture.  OPERATION:  Removal of syndesmosis screw, left ankle.  SURGEON:  Marlowe Kays, M.D.  ASSISTANT:  Nurse.  ANESTHESIA:  General.  PATHOLOGY/JUSTIFICATION FOR PROCEDURE:  The original surgery was roughly 3 months ago.  Fractures are healed.  It is time for removal of the syndesmosis screws, so she can begin full weightbearing.  PROCEDURE:  Satisfactory general anesthesia, pneumatic tourniquet with the left leg Esmarch out nonsterilely and tourniquet inflated to 250 mmHg.  The left leg was then prepped with DuraPrep from midcalf to toes and draped in sterile field.  Using the mini C-arm, I was able to localize the level of the syndesmosis screw and I made incision over that locating the screw confirming it with a screwdriver and the screw. I then removed the screw without complication and it will be safe for the patient.  Soft tissue was infiltrated with Marcaine with adrenaline and the wound closed in layers with interrupted 3-0 Vicryl subcutaneous tissue, and interrupted 4-0 nylon mattress sutures in the skin. Betadine, Adaptic, dry sterile dressing were applied.  Tourniquet was released.  At the time of this dictation, she was on her way to recovery room in satisfactory condition with no known complications.          ______________________________ Marlowe Kays,  M.D.     JA/MEDQ  D:  02/01/2012  T:  02/02/2012  Job:  [id number]

## 2012-02-04 ENCOUNTER — Encounter (HOSPITAL_BASED_OUTPATIENT_CLINIC_OR_DEPARTMENT_OTHER): Payer: Self-pay | Admitting: Orthopedic Surgery

## 2012-03-05 ENCOUNTER — Other Ambulatory Visit: Payer: Self-pay | Admitting: Internal Medicine

## 2012-03-05 DIAGNOSIS — Z1231 Encounter for screening mammogram for malignant neoplasm of breast: Secondary | ICD-10-CM

## 2012-04-16 ENCOUNTER — Ambulatory Visit
Admission: RE | Admit: 2012-04-16 | Discharge: 2012-04-16 | Disposition: A | Discharge status: Home / Self Care | Payer: Medicare Other | Source: Ambulatory Visit | Attending: Internal Medicine | Admitting: Internal Medicine

## 2012-04-16 DIAGNOSIS — Z1231 Encounter for screening mammogram for malignant neoplasm of breast: Secondary | ICD-10-CM

## 2012-07-02 ENCOUNTER — Other Ambulatory Visit: Payer: Self-pay | Admitting: Orthopedic Surgery

## 2012-07-09 ENCOUNTER — Encounter (HOSPITAL_BASED_OUTPATIENT_CLINIC_OR_DEPARTMENT_OTHER): Payer: Self-pay | Admitting: *Deleted

## 2012-07-09 NOTE — Progress Notes (Signed)
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 Daughter to bring her No labs needed

## 2012-07-16 ENCOUNTER — Encounter (HOSPITAL_BASED_OUTPATIENT_CLINIC_OR_DEPARTMENT_OTHER): Payer: Self-pay | Admitting: Anesthesiology

## 2012-07-16 ENCOUNTER — Ambulatory Visit (HOSPITAL_BASED_OUTPATIENT_CLINIC_OR_DEPARTMENT_OTHER): Payer: Medicare Other | Admitting: Anesthesiology

## 2012-07-16 ENCOUNTER — Encounter (HOSPITAL_BASED_OUTPATIENT_CLINIC_OR_DEPARTMENT_OTHER)
Admission: RE | Disposition: A | Discharge status: Home / Self Care | Payer: Self-pay | Source: Ambulatory Visit | Attending: Orthopedic Surgery

## 2012-07-16 ENCOUNTER — Encounter (HOSPITAL_BASED_OUTPATIENT_CLINIC_OR_DEPARTMENT_OTHER): Payer: Self-pay | Admitting: Orthopedic Surgery

## 2012-07-16 ENCOUNTER — Ambulatory Visit (HOSPITAL_BASED_OUTPATIENT_CLINIC_OR_DEPARTMENT_OTHER)
Admission: RE | Admit: 2012-07-16 | Discharge: 2012-07-16 | Disposition: A | Discharge status: Home / Self Care | Payer: Medicare Other | Source: Ambulatory Visit | Attending: Orthopedic Surgery | Admitting: Orthopedic Surgery

## 2012-07-16 DIAGNOSIS — M898X9 Other specified disorders of bone, unspecified site: Secondary | ICD-10-CM | POA: Insufficient documentation

## 2012-07-16 DIAGNOSIS — Z886 Allergy status to analgesic agent status: Secondary | ICD-10-CM | POA: Insufficient documentation

## 2012-07-16 DIAGNOSIS — M810 Age-related osteoporosis without current pathological fracture: Secondary | ICD-10-CM | POA: Insufficient documentation

## 2012-07-16 DIAGNOSIS — H269 Unspecified cataract: Secondary | ICD-10-CM | POA: Insufficient documentation

## 2012-07-16 DIAGNOSIS — F329 Major depressive disorder, single episode, unspecified: Secondary | ICD-10-CM | POA: Insufficient documentation

## 2012-07-16 DIAGNOSIS — F3289 Other specified depressive episodes: Secondary | ICD-10-CM | POA: Insufficient documentation

## 2012-07-16 DIAGNOSIS — Z79899 Other long term (current) drug therapy: Secondary | ICD-10-CM | POA: Insufficient documentation

## 2012-07-16 DIAGNOSIS — Z882 Allergy status to sulfonamides status: Secondary | ICD-10-CM | POA: Insufficient documentation

## 2012-07-16 DIAGNOSIS — M19049 Primary osteoarthritis, unspecified hand: Secondary | ICD-10-CM | POA: Insufficient documentation

## 2012-07-16 DIAGNOSIS — H9190 Unspecified hearing loss, unspecified ear: Secondary | ICD-10-CM | POA: Insufficient documentation

## 2012-07-16 DIAGNOSIS — K219 Gastro-esophageal reflux disease without esophagitis: Secondary | ICD-10-CM | POA: Insufficient documentation

## 2012-07-16 DIAGNOSIS — M6740 Ganglion, unspecified site: Secondary | ICD-10-CM | POA: Insufficient documentation

## 2012-07-16 DIAGNOSIS — E785 Hyperlipidemia, unspecified: Secondary | ICD-10-CM | POA: Insufficient documentation

## 2012-07-16 HISTORY — DX: Rectal prolapse: K62.3

## 2012-07-16 HISTORY — DX: Gastro-esophageal reflux disease without esophagitis: K21.9

## 2012-07-16 HISTORY — DX: Hyperlipidemia, unspecified: E78.5

## 2012-07-16 HISTORY — DX: Unspecified osteoarthritis, unspecified site: M19.90

## 2012-07-16 SURGERY — EXCISION METACARPAL MASS
Anesthesia: Monitor Anesthesia Care | Site: Finger | Laterality: Left | Wound class: Clean

## 2012-07-16 MED ORDER — CEFAZOLIN SODIUM-DEXTROSE 2-3 GM-% IV SOLR: 2.0000 g | INTRAVENOUS | Status: DC

## 2012-07-16 MED ORDER — HYDROCODONE-ACETAMINOPHEN 5-325 MG PO TABS: 1.0000 | ORAL_TABLET | Freq: Four times a day (QID) | ORAL | Status: DC | PRN

## 2012-07-16 MED ORDER — OXYCODONE HCL 5 MG/5ML PO SOLN: 5.0000 mg | Freq: Once | ORAL | Status: DC | PRN

## 2012-07-16 MED ORDER — CHLORHEXIDINE GLUCONATE 4 % EX LIQD: 60.0000 mL | Freq: Once | CUTANEOUS | Status: DC

## 2012-07-16 MED ORDER — ONDANSETRON HCL 4 MG/2ML IJ SOLN: 4.0000 mg | Freq: Once | INTRAMUSCULAR | Status: DC | PRN

## 2012-07-16 MED ORDER — HYDROMORPHONE HCL PF 1 MG/ML IJ SOLN: 0.2500 mg | INTRAMUSCULAR | Status: DC | PRN

## 2012-07-16 MED ORDER — PROPOFOL 10 MG/ML IV EMUL
INTRAVENOUS | Status: DC | PRN
  Administered 2012-07-16: 50 ug/kg/min via INTRAVENOUS

## 2012-07-16 MED ORDER — FENTANYL CITRATE 0.05 MG/ML IJ SOLN
INTRAMUSCULAR | Status: DC | PRN
  Administered 2012-07-16: 25 ug via INTRAVENOUS

## 2012-07-16 MED ORDER — OXYCODONE HCL 5 MG PO TABS: 5.0000 mg | ORAL_TABLET | Freq: Once | ORAL | Status: DC | PRN

## 2012-07-16 MED ORDER — ONDANSETRON HCL 4 MG/2ML IJ SOLN
INTRAMUSCULAR | Status: DC | PRN
  Administered 2012-07-16: 4 mg via INTRAVENOUS

## 2012-07-16 MED ORDER — LACTATED RINGERS IV SOLN
INTRAVENOUS | Status: DC
  Administered 2012-07-16 (×2): via INTRAVENOUS

## 2012-07-16 MED ORDER — BUPIVACAINE HCL (PF) 0.25 % IJ SOLN
INTRAMUSCULAR | Status: DC | PRN
  Administered 2012-07-16: 6 mL

## 2012-07-16 MED ORDER — CEFAZOLIN SODIUM-DEXTROSE 2-3 GM-% IV SOLR
2.0000 g | INTRAVENOUS | Status: AC
  Administered 2012-07-16: 2 g via INTRAVENOUS

## 2012-07-16 SURGICAL SUPPLY — 47 items
BANDAGE COBAN STERILE 2 (GAUZE/BANDAGES/DRESSINGS) IMPLANT
BANDAGE GAUZE ELAST BULKY 4 IN (GAUZE/BANDAGES/DRESSINGS) IMPLANT
BLADE MINI RND TIP GREEN BEAV (BLADE) IMPLANT
BLADE SURG 15 STRL LF DISP TIS (BLADE) ×1 IMPLANT
BLADE SURG 15 STRL SS (BLADE) ×1
BNDG COHESIVE 1X5 TAN STRL LF (GAUZE/BANDAGES/DRESSINGS) IMPLANT
BNDG COHESIVE 3X5 TAN STRL LF (GAUZE/BANDAGES/DRESSINGS) IMPLANT
BNDG ESMARK 4X9 LF (GAUZE/BANDAGES/DRESSINGS) IMPLANT
CHLORAPREP W/TINT 26ML (MISCELLANEOUS) ×2 IMPLANT
CLOTH BEACON ORANGE TIMEOUT ST (SAFETY) ×2 IMPLANT
CORDS BIPOLAR (ELECTRODE) ×2 IMPLANT
COVER MAYO STAND STRL (DRAPES) ×2 IMPLANT
COVER TABLE BACK 60X90 (DRAPES) ×2 IMPLANT
CUFF TOURNIQUET SINGLE 18IN (TOURNIQUET CUFF) IMPLANT
DECANTER SPIKE VIAL GLASS SM (MISCELLANEOUS) IMPLANT
DRAIN PENROSE 1/2X12 LTX STRL (WOUND CARE) IMPLANT
DRAPE EXTREMITY T 121X128X90 (DRAPE) ×2 IMPLANT
DRAPE SURG 17X23 STRL (DRAPES) ×2 IMPLANT
GAUZE XEROFORM 1X8 LF (GAUZE/BANDAGES/DRESSINGS) ×2 IMPLANT
GLOVE BIO SURGEON STRL SZ 6.5 (GLOVE) ×2 IMPLANT
GLOVE BIOGEL PI IND STRL 8.5 (GLOVE) ×1 IMPLANT
GLOVE BIOGEL PI INDICATOR 8.5 (GLOVE) ×1
GLOVE SURG ORTHO 8.0 STRL STRW (GLOVE) ×2 IMPLANT
GOWN BRE IMP PREV XXLGXLNG (GOWN DISPOSABLE) ×2 IMPLANT
GOWN PREVENTION PLUS XLARGE (GOWN DISPOSABLE) ×2 IMPLANT
NDL SAFETY ECLIPSE 18X1.5 (NEEDLE) ×1 IMPLANT
NEEDLE 27GAX1X1/2 (NEEDLE) IMPLANT
NEEDLE HYPO 18GX1.5 SHARP (NEEDLE) ×1
NS IRRIG 1000ML POUR BTL (IV SOLUTION) ×2 IMPLANT
PACK BASIN DAY SURGERY FS (CUSTOM PROCEDURE TRAY) ×2 IMPLANT
PAD CAST 3X4 CTTN HI CHSV (CAST SUPPLIES) IMPLANT
PADDING CAST ABS 3INX4YD NS (CAST SUPPLIES)
PADDING CAST ABS 4INX4YD NS (CAST SUPPLIES) ×1
PADDING CAST ABS COTTON 3X4 (CAST SUPPLIES) IMPLANT
PADDING CAST ABS COTTON 4X4 ST (CAST SUPPLIES) ×1 IMPLANT
PADDING CAST COTTON 3X4 STRL (CAST SUPPLIES)
SPLINT PLASTER CAST XFAST 3X15 (CAST SUPPLIES) IMPLANT
SPLINT PLASTER XTRA FASTSET 3X (CAST SUPPLIES)
SPONGE GAUZE 4X4 12PLY (GAUZE/BANDAGES/DRESSINGS) ×2 IMPLANT
STOCKINETTE 4X48 STRL (DRAPES) ×2 IMPLANT
SUT VIC AB 4-0 P2 18 (SUTURE) IMPLANT
SUT VICRYL RAPID 5 0 P 3 (SUTURE) IMPLANT
SUT VICRYL RAPIDE 4/0 PS 2 (SUTURE) ×2 IMPLANT
SYR BULB 3OZ (MISCELLANEOUS) ×2 IMPLANT
SYR CONTROL 10ML LL (SYRINGE) IMPLANT
TOWEL OR 17X24 6PK STRL BLUE (TOWEL DISPOSABLE) ×4 IMPLANT
UNDERPAD 30X30 INCONTINENT (UNDERPADS AND DIAPERS) ×2 IMPLANT

## 2012-07-16 NOTE — Transfer of Care (Signed)
Immediate Anesthesia Transfer of Care Note  Patient: Jillian [name]  Procedure(s) Performed: Procedure(s) with comments: EXCISION METACARPAL MASS, DEBRIDEMENT DISTAL INTERPHALANGEAL LEFT MIDDLE FINGER (Left) - ANESTHESIA: IV REGIONAL FAB  Patient Location: PACU  Anesthesia Type:Bier block  Level of Consciousness: sedated and patient cooperative  Airway & Oxygen Therapy: Patient Spontanous Breathing and Patient connected to face mask oxygen  Post-op Assessment: Report given to PACU RN and Post -op Vital signs reviewed and stable  Post vital signs: Reviewed and stable  Complications: No apparent anesthesia complications

## 2012-07-16 NOTE — Anesthesia Preprocedure Evaluation (Signed)
Anesthesia Evaluation  Patient identified by MRN, date of birth, ID band Patient awake    Reviewed: Allergy & Precautions, H&P , NPO status , Patient's Chart, lab work & pertinent test results  Airway Mallampati: I TM Distance: >3 FB Neck ROM: Full    Dental  (+) Teeth Intact and Dental Advisory Given   Pulmonary  breath sounds clear to auscultation        Cardiovascular Rhythm:Regular Rate:Normal     Neuro/Psych    GI/Hepatic GERD-  Medicated and Controlled,  Endo/Other    Renal/GU      Musculoskeletal   Abdominal   Peds  Hematology   Anesthesia Other Findings   Reproductive/Obstetrics                           Anesthesia Physical Anesthesia Plan  ASA: II  Anesthesia Plan: MAC and Bier Block   Post-op Pain Management:    Induction: Intravenous  Airway Management Planned: Simple Face Mask  Additional Equipment:   Intra-op Plan:   Post-operative Plan:   Informed Consent: I have reviewed the patients History and Physical, chart, labs and discussed the procedure including the risks, benefits and alternatives for the proposed anesthesia with the patient or authorized representative who has indicated his/her understanding and acceptance.   Dental advisory given  Plan Discussed with: CRNA, Anesthesiologist and Surgeon  Anesthesia Plan Comments:         Anesthesia Quick Evaluation

## 2012-07-16 NOTE — H&P (Signed)
Jillian [name] is an 79 year-old right-hand dominant female who with  a mass on the left middle finger distal interphalangeal joint.  This has been present for several weeks. She recalls no history of injury, but has opened this on several occasions. She has no history of diabetes or thyroid problems. She does have history of arthritis, no history of gout. She complains of a constant throbbing type pain with a feeling of swelling.  Activity makes this worse.  She has not tried anything for this other than the needle.  ALLERGIES:   Aspirin and sulfa.   MEDICATIONS:    Prilosec, Lexapro and Prolia shots.  SURGICAL HISTORY:    Broken ankle.  FAMILY MEDICAL HISTORY:    Positive for arthritis.  SOCIAL HISTORY:    She does not smoke or drink. She is married, retired.   REVIEW OF SYSTEMS:    Positive for cataracts, hearing loss, stomach ulcer, depression, easy bruising, otherwise negative. Jillian [name] is an 79 y.o. female.   Chief Complaint: mucoid cyst DJD lmf HPI: see above  Past Medical History  Diagnosis Date  . Osteoporosis   . Ankle fracture, bimalleolar, closed   . Arthritis   . GERD (gastroesophageal reflux disease)   . Hyperlipemia   . Rectal prolapse     not treated yet    Past Surgical History  Procedure Laterality Date  . Hemorrhoid surgery    . Fracture surgery    . Tear duct probing  03/2011  . Orif ankle fracture  11/07/2011    Procedure: OPEN REDUCTION INTERNAL FIXATION (ORIF) ANKLE FRACTURE;  Surgeon: Drucilla Schmidt, MD;  Location: [facility] OR;  Service: Orthopedics;  Laterality: Left;  . Hardware removal  02/01/2012    Procedure: HARDWARE REMOVAL;  Surgeon: Drucilla Schmidt, MD;  Location: [facility];  Service: Orthopedics;  Laterality: Left;  MIN C-ARM  left ankle hardware removal     History reviewed. No pertinent family history. Social History:  reports that she has never smoked. She does not have any smokeless tobacco history on file. She reports  that she does not drink alcohol or use illicit drugs.  Allergies:  Allergies  Allergen Reactions  . Doxycycline Nausea And Vomiting  . Aspirin Other (See Comments)    GI upset  . Ibuprofen Other (See Comments)    GI bleed precautions  . Sulfa Antibiotics Hives    No prescriptions prior to admission    No results found for this or any previous visit (from the past 48 hour(s)).  No results found.   Pertinent items are noted in HPI.  Height 5\' 2"  (1.575 m), weight 54.432 kg (120 lb).  General appearance: alert, cooperative and appears stated age Head: Normocephalic, without obvious abnormality Neck: no JVD Resp: clear to auscultation bilaterally Cardio: normal apical impulse GI: soft, non-tender; bowel sounds normal; no masses,  no organomegaly Extremities: extremities normal, atraumatic, no cyanosis or edema Pulses: 2+ and symmetric Skin: Skin color, texture, turgor normal. No rashes or lesions Neurologic: Alert and oriented X 3, normal strength and tone. Normal symmetric reflexes. Normal coordination and gait Incision/Wound: na  Assessment/Plan RADIOGRAPHS:    X-rays reveal degenerative changes at the distal interphalangeal joint to all of her fingers.    DIAGNOSIS:      Degenerative arthritis with mucoid cyst.    RECOMMENDATIONS/PLAN:     We have discussed the etiology of this with her.  She is advised not to open this again and we would  recommend surgical excision along with debridement of the joint.  She is aware that there is no guarantee with the surgery, the possibility of infection, recurrence, injury to arteries, nerves, tendons, stiffness, fusion may be the only way to entirely guarantee nonrecurrence.  She would like to proceed.  She is scheduled for excision mucoid cyst, debridement distal interphalangeal joint left middle finger as an outpatient.   Gabor Lusk R 07/16/2012, 9:23 AM

## 2012-07-16 NOTE — Brief Op Note (Signed)
07/16/2012  12:17 PM  PATIENT:  Jillian [name]  79 y.o. female  PRE-OPERATIVE DIAGNOSIS:  MUCOID TUMOR LEFT MIDDLE FINGER  POST-OPERATIVE DIAGNOSIS:  MUCOID TUMOR LEFT MIDDLE FINGER  PROCEDURE:  Procedure(s) with comments: EXCISION METACARPAL MASS, DEBRIDEMENT DISTAL INTERPHALANGEAL LEFT MIDDLE FINGER (Left) - ANESTHESIA: IV REGIONAL FAB  SURGEON:  Surgeon(s) and Role:    * Nicki Reaper, MD - Primary  PHYSICIAN ASSISTANT:   ASSISTANTS: none   ANESTHESIA:   local and regional  EBL:  Total I/O In: 800 [I.V.:800] Out: -   BLOOD ADMINISTERED:none  DRAINS: none   LOCAL MEDICATIONS USED:  MARCAINE     SPECIMEN:  Excision  DISPOSITION OF SPECIMEN:  PATHOLOGY  COUNTS:  YES  TOURNIQUET:    DICTATION: .Other Dictation: Dictation Number [id number]  PLAN OF CARE: Discharge to home after PACU  PATIENT DISPOSITION:  PACU - hemodynamically stable.

## 2012-07-16 NOTE — Op Note (Signed)
Dictation Number [id number]

## 2012-07-16 NOTE — Anesthesia Postprocedure Evaluation (Signed)
  Anesthesia Post-op Note  Patient: Jillian [name]  Procedure(s) Performed: Procedure(s) with comments: EXCISION METACARPAL MASS, DEBRIDEMENT DISTAL INTERPHALANGEAL LEFT MIDDLE FINGER (Left) - ANESTHESIA: IV REGIONAL FAB  Patient Location: PACU  Anesthesia Type:MAC  Level of Consciousness: awake, alert  and oriented  Airway and Oxygen Therapy: Patient Spontanous Breathing  Post-op Pain: mild  Post-op Assessment: Post-op Vital signs reviewed  Post-op Vital Signs: Reviewed  Complications: No apparent anesthesia complications

## 2012-07-21 NOTE — Op Note (Signed)
Jillian [name], KRABBENHOFT                 ACCOUNT NO.:  [id number]  MEDICAL RECORD NO.:  [id number]  LOCATION:                                 FACILITY:  PHYSICIAN:  Cindee Salt, M.D.       DATE OF BIRTH:  Dec 15, 1925  DATE OF PROCEDURE:  07/16/2012 DATE OF DISCHARGE:                              OPERATIVE REPORT   PREOPERATIVE DIAGNOSIS:  Mucoid tumor, distal interphalangeal joint, left middle finger.  POSTOPERATIVE DIAGNOSIS:  Mucoid tumor, distal interphalangeal joint, left middle finger.  OPERATION:  Excision mucoid cyst, debridement distal interphalangeal joint, left middle finger.  SURGEON:  Cindee Salt, MD  ANESTHESIA:  Forearm-based IV regional with metacarpal block.  ANESTHESIOLOGIST:  Sheldon Silvan, MD  HISTORY:  The patient is an 79 year old female with a history of mass dorsal aspect of the DIP joint of her left middle finger.  This transilluminates.  X-rays revealed degenerative changes.  She has elected to undergo surgical excision with debridement of the joint. Pre, peri and postoperative course have been discussed along with risks and complications.  She is aware that there is no guarantee with the surgery, possibility of infection, recurrence of injury to arteries, nerves, tendons, incomplete relief of symptoms and dystrophy, loss of mobility to the DIP joint, mallet deformity.  In the preoperative area, the patient is seen, the extremity marked by both patient and surgeon. Antibiotic given.  PROCEDURE:  The patient was brought to the operating room where a forearm-based IV regional anesthetic was carried out without difficulty. She was prepped using ChloraPrep, supine position, left arm free.  A 3- minute dry time was allowed.  Time-out taken, confirming patient and procedure.  A curvilinear incision was made over the distal interphalangeal joint, left index finger after metacarpal block was given 0.25% Marcaine without epinephrine, 6 mL was used.  The  dissection was carried down __________.  With blunt and sharp dissection, this was dissected free and sent to Pathology.  The joint was then opened both radially and ulnarly.  Significant osteophyte formation was present both radial and ulnar aspects of the middle phalanges.  This was removed with a small rongeur and a small House curette.  Specimen was sent to Pathology.  The wound was copiously irrigated with saline.  The skin then closed with interrupted 4-0 Vicryl Rapide sutures.  A sterile compressive dressing and splint to the finger was applied.  On deflation of the tourniquet, remaining fingers pinked.  She was taken to the recovery room for observation in satisfactory condition.  She will be discharged home to return to the [facility] in 1 week on [place].          ______________________________ Cindee Salt, M.D.     GK/MEDQ  D:  07/16/2012  T:  07/16/2012  Job:  [id number]

## 2013-01-14 ENCOUNTER — Encounter (INDEPENDENT_AMBULATORY_CARE_PROVIDER_SITE_OTHER): Payer: Self-pay | Admitting: General Surgery

## 2013-01-14 ENCOUNTER — Ambulatory Visit (INDEPENDENT_AMBULATORY_CARE_PROVIDER_SITE_OTHER): Payer: Medicare Other | Admitting: General Surgery

## 2013-01-14 VITALS — BP 122/68 | HR 60 | Temp 98.2°F | Resp 14 | Ht 62.5 in | Wt 127.2 lb

## 2013-01-14 DIAGNOSIS — K623 Rectal prolapse: Secondary | ICD-10-CM

## 2013-01-14 NOTE — Progress Notes (Signed)
Chief Complaint  Patient presents with  . New Evaluation    eval for rectal prolapse    HISTORY: Jillian [name] is a 79 y.o. female who presents to the office with fecal incontinence and rectal prlapse.  This had been occurring for several years and getting worse.  She describes no control of bowel movements and prolapse when she sits on the toilet.  It is continuous in nature.  Her bowel habits are regular and her bowel movements are mostly hard.  Her fiber intake is good.  She is s/p hemorrhoid procedures with Dr Earlene Plater.  Her last colonoscopy was a couple years ago and one polyp was found per pt ([facility]).  She has had 2 vaginal deliveries, with one prolonged labor and one with a vaginal tear.  She is incontinent to gas, liquid and solid stools.  She has never had any colon surgeries.    Past Medical History  Diagnosis Date  . Osteoporosis   . Ankle fracture, bimalleolar, closed   . Arthritis   . GERD (gastroesophageal reflux disease)   . Hyperlipemia   . Rectal prolapse     not treated yet  . Blood transfusion without reported diagnosis       Past Surgical History  Procedure Laterality Date  . Hemorrhoid surgery    . Fracture surgery    . Tear duct probing  03/2011  . Orif ankle fracture  11/07/2011    Procedure: OPEN REDUCTION INTERNAL FIXATION (ORIF) ANKLE FRACTURE;  Surgeon: Drucilla Schmidt, MD;  Location: [facility] OR;  Service: Orthopedics;  Laterality: Left;  . Hardware removal  02/01/2012    Procedure: HARDWARE REMOVAL;  Surgeon: Drucilla Schmidt, MD;  Location: [facility];  Service: Orthopedics;  Laterality: Left;  MIN C-ARM  left ankle hardware removal   . Colonoscopy          Current Outpatient Prescriptions  Medication Sig Dispense Refill  . cholecalciferol (VITAMIN D) 1000 UNITS tablet Take 1,000 Units by mouth daily.      Marland Kitchen denosumab (PROLIA) 60 MG/ML SOLN injection Inject 60 mg into the skin every 6 (six) months. Administer in upper arm, thigh, or  abdomen      . escitalopram (LEXAPRO) 5 MG tablet Take 5 mg by mouth daily.      . pravastatin (PRAVACHOL) 20 MG tablet Take 20 mg by mouth daily.       No current facility-administered medications for this visit.      Allergies  Allergen Reactions  . Doxycycline Nausea And Vomiting  . Aspirin Other (See Comments)    GI upset  . Ibuprofen Other (See Comments)    GI bleed precautions  . Sulfa Antibiotics Hives      Family History  Problem Relation Age of Onset  . Heart disease Mother   . Cancer Father     stomach  . Cancer Brother     stomach    History   Social History  . Marital Status: Widowed    Spouse Name: N/A    Number of Children: N/A  . Years of Education: N/A   Social History Main Topics  . Smoking status: Former Smoker    Quit date: 05/01/1943  . Smokeless tobacco: Never Used  . Alcohol Use: No  . Drug Use: No  . Sexual Activity: None   Other Topics Concern  . None   Social History Narrative  . None      REVIEW OF SYSTEMS - PERTINENT  POSITIVES ONLY: Review of Systems - General ROS: negative for - chills, fever or weight loss Hematological and Lymphatic ROS: negative for - bleeding problems, blood clots or bruising Respiratory ROS: no cough, shortness of breath, or wheezing Cardiovascular ROS: no chest pain or dyspnea on exertion Gastrointestinal ROS: no abdominal pain, change in bowel habits, or black or bloody stools Genito-Urinary ROS: no dysuria, trouble voiding, or hematuria  EXAM: Filed Vitals:   01/14/13 1332  BP: 122/68  Pulse: 60  Temp: 98.2 F (36.8 C)  Resp: 14    General appearance: alert and cooperative Resp: clear to auscultation bilaterally Cardio: regular rate and rhythm GI: normal findings: soft, non-tender   Procedure: Anoscopy Surgeon: Maisie Fus Diagnosis: rectal prolapse  Assistant: Christella Scheuermann After the risks and benefits were explained, verbal consent was obtained for above procedure  Anesthesia: none Findings: no  masses or lesions, good rectal tone and squeeze, short anal canal.  On sitting on the toilet, there is a 3-4 cm rectal prolapse noted.     ASSESSMENT AND PLAN: Jillian [name] is a 79 y.o. F with rectal prolapse.  We discussed the types of procedures that could be done.  This included abd rectopexy and perineal procedures.  We discussed that all procedures carry different risks and recurrence rates.  We discussed the risks of doing nothing and the concern for prolapse that wasn't reducible.  I recommended the Del[place]e procedure, but would probably do ok with an [place] as well (both perineal procedures).  We discussed that sometimes incontinence will get worse after surgery.  They will discuss this and call the office if they would like to proceed with surgery.    Vanita Panda, MD Colon and Rectal Surgery / General Surgery [facility]      Visit Diagnoses: 1. Rectal prolapse     Primary Care Physician: Ezequiel Kayser, MD

## 2013-01-14 NOTE — Patient Instructions (Signed)
Rectal Prolapse  What is rectal prolapse? Rectal prolapse is a condition in which the rectum (the lower end of the colon, located just above the anus) becomes stretched out and protrudes out of the anus. Weakness of the anal sphincter muscle is often associated with rectal prolapse at this stage, resulting in leakage of stool or mucus. While the condition occurs in both sexes, it is much more common in women than men. Why does it occur? Several factors may contribute to the development of rectal prolapse. It may come from a lifelong habit of straining to have bowel movements or as a late consequence of the childbirth process. Rarely, there may be a genetic predisposition. It seems to be a part of the aging process in many patients who experience stretching of the ligaments that support the rectum inside the pelvis as well as weakening of the anal sphincter muscle. Sometimes rectal prolapse results from generalized pelvic floor dysfunction, in association with urinary incontinence and pelvic organ prolapse as well. Neurological problems, such as spinal cord transection or spinal cord disease, can also lead to prolapse. In most cases, however, no single cause is identified. Is rectal prolapse the same as hemorrhoids? Some of the symptoms may be the same: bleeding and/or tissue that protrudes from the rectum. Rectal prolapse, however, involves a segment of the bowel located higher up within the body, while hemorrhoids develop near the anal opening. How is rectal prolapse diagnosed? A physician can often diagnose this condition with a careful history and a complete anorectal examination. To demonstrate the prolapse, patients may be asked to sit on a commode and "strain" as if having a bowel movement. Occasionally, a rectal prolapse may be "hidden" or internal, making the diagnosis more difficult. In this situation, an x-ray examination called a videodefecogram may be helpful. This examination, which takes  x-ray pictures while the patient is having a bowel movement, can also assist the physician in determining whether surgery may be beneficial and which operation may be appropriate. Anorectal manometry may also be used to evaluate the function of the muscles around the rectum as they relate to having a bowel movement. How is rectal prolapse treated? Although constipation and straining may contribute to the development of rectal prolapse, simply correcting these problems may not improve the prolapse once it has developed. There are many different ways to surgically correct rectal prolapse. Abdominal or rectal surgery may be suggested. An abdominal repair may be approached laparoscopically in selected patients. The decision to recommend an abdominal or rectal surgery takes into account many factors, including age, physical condition, extent of prolapse and the results of various tests. How successful is treatment? A great majority of patients are completely relieved of symptoms, or are significantly helped, by the appropriate procedure. Success depends on many factors, including the status of a patient's anal sphincter muscle before surgery, whether the prolapse is internal or external, the overall condition of the patient. If the anal sphincter muscles have been weakened, either because of the rectal prolapse or for some other reason, they have the potential to regain strength after the rectal prolapse has been corrected. It may take up to a year to determine the ultimate impact of the surgery on bowel function. Chronic constipation and straining after surgical correction should be avoided.  What is a colon and rectal surgeon? Colon and rectal surgeons are experts in the surgical and non-surgical treatment of diseases of the colon, rectum and anus. They have completed advanced surgical training in the treatment   of these diseases as well as full general surgical training. Board-certified colon and rectal surgeons  complete residencies in general surgery and colon and rectal surgery, and pass intensive examinations conducted by the American Board of Surgery and the American Board of Colon and Rectal Surgery. They are well-versed in the treatment of both benign and malignant diseases of the colon, rectum and anus and are able to perform routine screening examinations and surgically treat conditions if indicated to do so.   2012 American Society of Colon & Rectal Surgeons  

## 2013-03-19 ENCOUNTER — Other Ambulatory Visit: Payer: Self-pay

## 2013-03-19 DIAGNOSIS — Z1231 Encounter for screening mammogram for malignant neoplasm of breast: Secondary | ICD-10-CM

## 2013-04-21 ENCOUNTER — Ambulatory Visit
Admission: RE | Admit: 2013-04-21 | Discharge: 2013-04-21 | Disposition: A | Discharge status: Home / Self Care | Payer: Medicare Other | Source: Ambulatory Visit

## 2013-04-21 DIAGNOSIS — Z1231 Encounter for screening mammogram for malignant neoplasm of breast: Secondary | ICD-10-CM

## 2013-08-05 ENCOUNTER — Ambulatory Visit (INDEPENDENT_AMBULATORY_CARE_PROVIDER_SITE_OTHER): Payer: Medicare HMO | Admitting: Podiatry

## 2013-08-05 ENCOUNTER — Encounter: Payer: Self-pay | Admitting: Podiatry

## 2013-08-05 VITALS — BP 134/87 | HR 64 | Resp 12

## 2013-08-05 DIAGNOSIS — L84 Corns and callosities: Secondary | ICD-10-CM

## 2013-08-05 DIAGNOSIS — M216X9 Other acquired deformities of unspecified foot: Secondary | ICD-10-CM

## 2013-08-05 NOTE — Patient Instructions (Signed)
Wear the cut out pads to take pressure off the callused areas on right and left feet. If this is helpful a more permanent foot orthotic with a pocket and permanent pad could be considered.

## 2013-08-05 NOTE — Progress Notes (Signed)
   Subjective:    Patient ID: Jillian [name], female    DOB: 1926/03/25, 80 y.o.   MRN: [id number]  HPI PT STATED BOTH FEET HAVE CALLUS AND THEY BEEN SORE 3 YEARS. THE CALLUSES ARE GETTING WORSE AND THICKER. THE FEET GET AGGRAVATED BY WALKING AND PRESSURE. THE PODIATRIST TRIM THE CALLUSES BUT THEY KEEP COMING BACK.    Review of Systems  HENT: Positive for hearing loss.   Musculoskeletal: Positive for gait problem and myalgias.  Allergic/Immunologic: Positive for food allergies.  Hematological: Bruises/bleeds easily.  All other systems reviewed and are negative.      Objective:   Physical Exam Orientated [phone] 80 year old Asian female  Vascular: DP is 4/4 bilaterally and PT is 2/4 bilaterally  Neurological: Sensation to 10 g monofilament wire intact 5/5 bilaterally. Vibratory sensation intact bilaterally. Ankle reflexes reactive bilaterally.  Dermatological: Atrophic skin without any hair growth noted. Surgical scar is noted the medial lateral left ankle. Plantar calluses sub-first MPJ noted bilaterally.  Musculoskeletal: Functional hallux limitus bilaterally, with prominent first metatarsal heads with the foot in a neutral position. The areas on the plantar first MPJ are tender to palpation.        Assessment & Plan:   Assessment: Relative plantarflexion/functional hallux limitus bilaterally with associated painful chronic keratoses, first MPJs bilaterally.  Plan: Had a detailed discussion with patient today making aware that the plantar first MPJs were tender because of her foot function and shape. I recommended that she wear a soft pad in her shoe and attached cut out surgical felt pads to offload the first MPJ, bilaterally. The plantar keratoses right and left were debrided.  Patient better understands that she is a chronic condition and will recommend debridement at her request. In addition, I attached surgical felt pads to the existing shoe insoles to offload the first MPJ.  If the surgical felt pads provided a noticeable relief then a more permanent foot orthotic would be made.  Reappoint at patient's request

## 2013-08-06 ENCOUNTER — Encounter: Payer: Self-pay | Admitting: Podiatry

## 2014-02-04 ENCOUNTER — Other Ambulatory Visit: Payer: Self-pay | Admitting: Internal Medicine

## 2014-02-04 DIAGNOSIS — M542 Cervicalgia: Secondary | ICD-10-CM

## 2014-02-08 ENCOUNTER — Other Ambulatory Visit: Payer: Self-pay | Admitting: Internal Medicine

## 2014-02-08 DIAGNOSIS — M542 Cervicalgia: Secondary | ICD-10-CM

## 2014-02-10 ENCOUNTER — Ambulatory Visit
Admission: RE | Admit: 2014-02-10 | Discharge: 2014-02-10 | Disposition: A | Discharge status: Home / Self Care | Payer: Medicare HMO | Source: Ambulatory Visit | Attending: Internal Medicine | Admitting: Internal Medicine

## 2014-02-10 ENCOUNTER — Other Ambulatory Visit: Payer: Self-pay

## 2014-02-10 DIAGNOSIS — M542 Cervicalgia: Secondary | ICD-10-CM

## 2014-02-16 ENCOUNTER — Other Ambulatory Visit: Payer: Self-pay | Admitting: Neurosurgery

## 2014-02-16 ENCOUNTER — Other Ambulatory Visit: Payer: Self-pay

## 2014-02-17 ENCOUNTER — Encounter (HOSPITAL_COMMUNITY): Payer: Self-pay | Admitting: Pharmacy Technician

## 2014-02-25 ENCOUNTER — Encounter (HOSPITAL_COMMUNITY)
Admission: RE | Admit: 2014-02-25 | Discharge: 2014-02-25 | Disposition: A | Discharge status: Home / Self Care | Payer: Medicare HMO | Source: Ambulatory Visit | Attending: Neurosurgery | Admitting: Neurosurgery

## 2014-02-25 ENCOUNTER — Encounter (HOSPITAL_COMMUNITY): Payer: Self-pay

## 2014-02-25 DIAGNOSIS — Z01812 Encounter for preprocedural laboratory examination: Secondary | ICD-10-CM | POA: Diagnosis present

## 2014-02-25 HISTORY — DX: Shortness of breath: R06.02

## 2014-02-25 HISTORY — DX: Dysphagia, unspecified: R13.10

## 2014-02-25 HISTORY — DX: Depression, unspecified: F32.A

## 2014-02-25 HISTORY — DX: Other complications of anesthesia, initial encounter: T88.59XA

## 2014-02-25 HISTORY — DX: Other specified postprocedural states: R11.2

## 2014-02-25 HISTORY — DX: Other specified postprocedural states: Z98.890

## 2014-02-25 HISTORY — DX: Major depressive disorder, single episode, unspecified: F32.9

## 2014-02-25 HISTORY — DX: Adverse effect of unspecified anesthetic, initial encounter: T41.45XA

## 2014-02-25 LAB — BASIC METABOLIC PANEL
Anion gap: 12 (ref 5–15)
BUN: 12 mg/dL (ref 6–23)
CALCIUM: 9.6 mg/dL (ref 8.4–10.5)
CO2: 26 mEq/L (ref 19–32)
Chloride: 101 mEq/L (ref 96–112)
Creatinine, Ser: 0.68 mg/dL (ref 0.50–1.10)
GFR calc Af Amer: 89 mL/min — ABNORMAL LOW (ref >90)
GFR, EST NON AFRICAN AMERICAN: 77 mL/min — AB (ref >90)
GLUCOSE: 99 mg/dL (ref 70–99)
Potassium: 4.4 mEq/L (ref 3.7–5.3)
Sodium: 139 mEq/L (ref 137–147)

## 2014-02-25 LAB — CBC
HEMATOCRIT: 38.7 % (ref 36.0–46.0)
Hemoglobin: 13.1 g/dL (ref 12.0–15.0)
MCH: 31.8 pg (ref 26.0–34.0)
MCHC: 33.9 g/dL (ref 30.0–36.0)
MCV: 93.9 fL (ref 78.0–100.0)
Platelets: 97 10*3/uL — ABNORMAL LOW (ref 150–400)
RBC: 4.12 MIL/uL (ref 3.87–5.11)
RDW: 13.1 % (ref 11.5–15.5)
WBC: 3.5 10*3/uL — ABNORMAL LOW (ref 4.0–10.5)

## 2014-02-25 LAB — SURGICAL PCR SCREEN
MRSA, PCR: NEGATIVE
Staphylococcus aureus: NEGATIVE

## 2014-02-25 NOTE — Pre-Procedure Instructions (Signed)
Cheryln Manlyeruko I Barkalow  02/25/2014   Your procedure is scheduled on:  Monday March 01, 2014 at 7:30 AM.  [facility].  Call this number if you have problems the morning of surgery: [phone]  Call this number if you have any questions prior to surgery: [phone]   Remember:   Do not eat food or drink liquids after midnight.   Take these medicines the morning of surgery with A SIP OF WATER: Hydrocodone if needed, Omeprazole (Prilosec), and Zofran if needed   Do not wear jewelry, make-up or nail polish.  Do not wear lotions, powders, or perfumes.   Do not shave 48 hours prior to surgery.  Do not bring valuables to the hospital.  [facility] belongings or valuables.               Contacts, dentures or bridgework may not be worn into surgery.  Leave suitcase in the car. After surgery it may be brought to your room.  For patients admitted to the hospital, discharge time is determined by your treatment team.               Patients discharged the day of surgery will not be allowed to drive home.  Name and phone number of your driver: Family/Friend  Special Instructions: Shower using CHG soap the night before and the morning of your surgery   Please read over the following fact sheets that you were given: Pain Booklet, Coughing and Deep Breathing, MRSA Information and Surgical Site Infection Prevention

## 2014-02-28 MED ORDER — CEFAZOLIN SODIUM-DEXTROSE 2-3 GM-% IV SOLR
2.0000 g | INTRAVENOUS | Status: AC
  Administered 2014-03-01: 2 g via INTRAVENOUS
  Filled 2014-02-28: qty 50

## 2014-03-01 ENCOUNTER — Encounter (HOSPITAL_COMMUNITY)
Admission: RE | Disposition: A | Discharge status: Home / Self Care | Payer: Self-pay | Source: Ambulatory Visit | Attending: Neurosurgery

## 2014-03-01 ENCOUNTER — Inpatient Hospital Stay (HOSPITAL_COMMUNITY): Payer: Medicare HMO

## 2014-03-01 ENCOUNTER — Inpatient Hospital Stay (HOSPITAL_COMMUNITY): Payer: Medicare HMO | Admitting: Anesthesiology

## 2014-03-01 ENCOUNTER — Encounter (HOSPITAL_COMMUNITY): Payer: Self-pay | Admitting: *Deleted

## 2014-03-01 ENCOUNTER — Inpatient Hospital Stay (HOSPITAL_COMMUNITY)
Admission: RE | Admit: 2014-03-01 | Discharge: 2014-03-02 | DRG: 473 | Disposition: A | Discharge status: Home / Self Care | Payer: Medicare HMO | Source: Ambulatory Visit | Attending: Neurosurgery | Admitting: Neurosurgery

## 2014-03-01 DIAGNOSIS — M810 Age-related osteoporosis without current pathological fracture: Secondary | ICD-10-CM | POA: Diagnosis present

## 2014-03-01 DIAGNOSIS — G959 Disease of spinal cord, unspecified: Secondary | ICD-10-CM | POA: Diagnosis present

## 2014-03-01 DIAGNOSIS — E785 Hyperlipidemia, unspecified: Secondary | ICD-10-CM | POA: Diagnosis present

## 2014-03-01 DIAGNOSIS — M542 Cervicalgia: Secondary | ICD-10-CM | POA: Diagnosis present

## 2014-03-01 DIAGNOSIS — M4712 Other spondylosis with myelopathy, cervical region: Principal | ICD-10-CM | POA: Diagnosis present

## 2014-03-01 DIAGNOSIS — Z87891 Personal history of nicotine dependence: Secondary | ICD-10-CM

## 2014-03-01 DIAGNOSIS — K219 Gastro-esophageal reflux disease without esophagitis: Secondary | ICD-10-CM | POA: Diagnosis present

## 2014-03-01 DIAGNOSIS — M5412 Radiculopathy, cervical region: Secondary | ICD-10-CM

## 2014-03-01 HISTORY — PX: ANTERIOR CERVICAL DECOMP/DISCECTOMY FUSION: SHX1161

## 2014-03-01 SURGERY — ANTERIOR CERVICAL DECOMPRESSION/DISCECTOMY FUSION 2 LEVELS
Anesthesia: General

## 2014-03-01 MED ORDER — ONDANSETRON HCL 4 MG/2ML IJ SOLN
INTRAMUSCULAR | Status: DC | PRN
  Administered 2014-03-01: 4 mg via INTRAVENOUS

## 2014-03-01 MED ORDER — EPHEDRINE SULFATE 50 MG/ML IJ SOLN
INTRAMUSCULAR | Status: AC
  Filled 2014-03-01: qty 1

## 2014-03-01 MED ORDER — THROMBIN 5000 UNITS EX SOLR
CUTANEOUS | Status: DC | PRN
  Administered 2014-03-01 (×2): 5000 [IU] via TOPICAL

## 2014-03-01 MED ORDER — ONDANSETRON HCL 4 MG PO TABS: 4.0000 mg | ORAL_TABLET | Freq: Every day | ORAL | Status: DC | PRN

## 2014-03-01 MED ORDER — SODIUM CHLORIDE 0.9 % IJ SOLN
3.0000 mL | Freq: Two times a day (BID) | INTRAMUSCULAR | Status: DC
  Administered 2014-03-01: 3 mL via INTRAVENOUS

## 2014-03-01 MED ORDER — ERGOCALCIFEROL 1.25 MG (50000 UT) PO CAPS: 50000.0000 [IU] | ORAL_CAPSULE | ORAL | Status: DC

## 2014-03-01 MED ORDER — HYDROMORPHONE HCL 1 MG/ML IJ SOLN: 0.5000 mg | INTRAMUSCULAR | Status: DC | PRN

## 2014-03-01 MED ORDER — HEMOSTATIC AGENTS (NO CHARGE) OPTIME
TOPICAL | Status: DC | PRN
  Administered 2014-03-01: 1 via TOPICAL

## 2014-03-01 MED ORDER — SODIUM CHLORIDE 0.9 % IV SOLN: 250.0000 mL | INTRAVENOUS | Status: DC

## 2014-03-01 MED ORDER — SODIUM CHLORIDE 0.9 % IR SOLN
Status: DC | PRN
  Administered 2014-03-01: 09:00:00

## 2014-03-01 MED ORDER — PROPOFOL 10 MG/ML IV BOLUS
INTRAVENOUS | Status: AC
  Filled 2014-03-01: qty 20

## 2014-03-01 MED ORDER — VITAMIN D (ERGOCALCIFEROL) 1.25 MG (50000 UNIT) PO CAPS
50000.0000 [IU] | ORAL_CAPSULE | ORAL | Status: DC
  Administered 2014-03-01: 50000 [IU] via ORAL
  Filled 2014-03-01: qty 1

## 2014-03-01 MED ORDER — OXYCODONE-ACETAMINOPHEN 5-325 MG PO TABS: 1.0000 | ORAL_TABLET | ORAL | Status: DC | PRN

## 2014-03-01 MED ORDER — GLYCOPYRROLATE 0.2 MG/ML IJ SOLN
INTRAMUSCULAR | Status: DC | PRN
  Administered 2014-03-01: 0.4 mg via INTRAVENOUS

## 2014-03-01 MED ORDER — ACETAMINOPHEN 650 MG RE SUPP: 650.0000 mg | RECTAL | Status: DC | PRN

## 2014-03-01 MED ORDER — SENNA 8.6 MG PO TABS
1.0000 | ORAL_TABLET | Freq: Two times a day (BID) | ORAL | Status: DC
  Administered 2014-03-01 – 2014-03-02 (×3): 8.6 mg via ORAL
  Filled 2014-03-01 (×4): qty 1

## 2014-03-01 MED ORDER — INFLUENZA VAC SPLIT QUAD 0.5 ML IM SUSY
0.5000 mL | PREFILLED_SYRINGE | INTRAMUSCULAR | Status: AC
  Administered 2014-03-02: 0.5 mL via INTRAMUSCULAR
  Filled 2014-03-01 (×2): qty 0.5

## 2014-03-01 MED ORDER — SODIUM CHLORIDE 0.9 % IJ SOLN: 3.0000 mL | INTRAMUSCULAR | Status: DC | PRN

## 2014-03-01 MED ORDER — ROCURONIUM BROMIDE 50 MG/5ML IV SOLN
INTRAVENOUS | Status: AC
  Filled 2014-03-01: qty 1

## 2014-03-01 MED ORDER — SODIUM CHLORIDE 0.9 % IJ SOLN
INTRAMUSCULAR | Status: AC
  Filled 2014-03-01: qty 10

## 2014-03-01 MED ORDER — MENTHOL 3 MG MT LOZG
1.0000 | LOZENGE | OROMUCOSAL | Status: DC | PRN
  Filled 2014-03-01: qty 9

## 2014-03-01 MED ORDER — 0.9 % SODIUM CHLORIDE (POUR BTL) OPTIME
TOPICAL | Status: DC | PRN
  Administered 2014-03-01: 1000 mL

## 2014-03-01 MED ORDER — ONDANSETRON HCL 4 MG/2ML IJ SOLN: 4.0000 mg | Freq: Once | INTRAMUSCULAR | Status: DC | PRN

## 2014-03-01 MED ORDER — PROPOFOL 10 MG/ML IV BOLUS
INTRAVENOUS | Status: DC | PRN
  Administered 2014-03-01: 140 mg via INTRAVENOUS

## 2014-03-01 MED ORDER — PHENYLEPHRINE 40 MCG/ML (10ML) SYRINGE FOR IV PUSH (FOR BLOOD PRESSURE SUPPORT)
PREFILLED_SYRINGE | INTRAVENOUS | Status: AC
  Filled 2014-03-01: qty 10

## 2014-03-01 MED ORDER — FENTANYL CITRATE 0.05 MG/ML IJ SOLN
25.0000 ug | INTRAMUSCULAR | Status: DC | PRN
  Administered 2014-03-01 (×2): 25 ug via INTRAVENOUS

## 2014-03-01 MED ORDER — PHENOL 1.4 % MT LIQD
1.0000 | OROMUCOSAL | Status: DC | PRN
  Administered 2014-03-01: 1 via OROMUCOSAL
  Filled 2014-03-01: qty 177

## 2014-03-01 MED ORDER — ALUM & MAG HYDROXIDE-SIMETH 200-200-20 MG/5ML PO SUSP: 30.0000 mL | Freq: Four times a day (QID) | ORAL | Status: DC | PRN

## 2014-03-01 MED ORDER — HYDROCODONE-ACETAMINOPHEN 5-325 MG PO TABS
1.0000 | ORAL_TABLET | ORAL | Status: DC | PRN
  Administered 2014-03-01 (×2): 1 via ORAL
  Filled 2014-03-01 (×3): qty 1

## 2014-03-01 MED ORDER — ROCURONIUM BROMIDE 100 MG/10ML IV SOLN
INTRAVENOUS | Status: DC | PRN
  Administered 2014-03-01: 40 mg via INTRAVENOUS

## 2014-03-01 MED ORDER — NEOSTIGMINE METHYLSULFATE 10 MG/10ML IV SOLN
INTRAVENOUS | Status: DC | PRN
  Administered 2014-03-01: 3 mg via INTRAVENOUS

## 2014-03-01 MED ORDER — FENTANYL CITRATE 0.05 MG/ML IJ SOLN
INTRAMUSCULAR | Status: DC | PRN
  Administered 2014-03-01: 100 ug via INTRAVENOUS

## 2014-03-01 MED ORDER — CEFAZOLIN SODIUM 1-5 GM-% IV SOLN
1.0000 g | Freq: Three times a day (TID) | INTRAVENOUS | Status: AC
  Administered 2014-03-01 (×2): 1 g via INTRAVENOUS
  Filled 2014-03-01 (×2): qty 50

## 2014-03-01 MED ORDER — SUCCINYLCHOLINE CHLORIDE 20 MG/ML IJ SOLN
INTRAMUSCULAR | Status: AC
  Filled 2014-03-01: qty 1

## 2014-03-01 MED ORDER — SIMETHICONE 80 MG PO CHEW
80.0000 mg | CHEWABLE_TABLET | Freq: Four times a day (QID) | ORAL | Status: DC | PRN
  Filled 2014-03-01: qty 1

## 2014-03-01 MED ORDER — FENTANYL CITRATE 0.05 MG/ML IJ SOLN
INTRAMUSCULAR | Status: AC
Start: 2014-03-01 — End: 2014-03-01
  Filled 2014-03-01: qty 2

## 2014-03-01 MED ORDER — ONDANSETRON HCL 4 MG/2ML IJ SOLN: 4.0000 mg | INTRAMUSCULAR | Status: DC | PRN

## 2014-03-01 MED ORDER — ACETAMINOPHEN 325 MG PO TABS: 650.0000 mg | ORAL_TABLET | ORAL | Status: DC | PRN

## 2014-03-01 MED ORDER — PANTOPRAZOLE SODIUM 40 MG PO TBEC
40.0000 mg | DELAYED_RELEASE_TABLET | Freq: Every day | ORAL | Status: DC
  Administered 2014-03-01 – 2014-03-02 (×2): 40 mg via ORAL
  Filled 2014-03-01 (×2): qty 1

## 2014-03-01 MED ORDER — PRAVASTATIN SODIUM 20 MG PO TABS
20.0000 mg | ORAL_TABLET | Freq: Every day | ORAL | Status: DC
  Administered 2014-03-01: 20 mg via ORAL
  Filled 2014-03-01 (×2): qty 1

## 2014-03-01 MED ORDER — LIDOCAINE HCL (CARDIAC) 20 MG/ML IV SOLN
INTRAVENOUS | Status: DC | PRN
  Administered 2014-03-01: 70 mg via INTRAVENOUS

## 2014-03-01 MED ORDER — FENTANYL CITRATE 0.05 MG/ML IJ SOLN
INTRAMUSCULAR | Status: AC
  Filled 2014-03-01: qty 5

## 2014-03-01 MED ORDER — LACTATED RINGERS IV SOLN
INTRAVENOUS | Status: DC | PRN
  Administered 2014-03-01 (×2): via INTRAVENOUS

## 2014-03-01 MED ORDER — LIDOCAINE HCL (CARDIAC) 20 MG/ML IV SOLN
INTRAVENOUS | Status: AC
  Filled 2014-03-01: qty 5

## 2014-03-01 MED ORDER — DEXAMETHASONE SODIUM PHOSPHATE 4 MG/ML IJ SOLN
INTRAMUSCULAR | Status: DC | PRN
  Administered 2014-03-01: 4 mg via INTRAVENOUS

## 2014-03-01 MED ORDER — CYCLOBENZAPRINE HCL 10 MG PO TABS
10.0000 mg | ORAL_TABLET | Freq: Three times a day (TID) | ORAL | Status: DC | PRN
  Administered 2014-03-01: 10 mg via ORAL
  Filled 2014-03-01: qty 1

## 2014-03-01 SURGICAL SUPPLY — 59 items
BAG DECANTER FOR FLEXI CONT (MISCELLANEOUS) ×3 IMPLANT
BENZOIN TINCTURE PRP APPL 2/3 (GAUZE/BANDAGES/DRESSINGS) ×3 IMPLANT
BRUSH SCRUB EZ PLAIN DRY (MISCELLANEOUS) ×3 IMPLANT
BUR MATCHSTICK NEURO 3.0 LAGG (BURR) ×3 IMPLANT
CAGE PEEK 6X14X11 (Cage) ×2 IMPLANT
CAGE SPNL 11X14X6XRADOPQ (Cage) ×1 IMPLANT
CANISTER SUCT 3000ML (MISCELLANEOUS) ×3 IMPLANT
CLOSURE WOUND 1/2 X4 (GAUZE/BANDAGES/DRESSINGS) ×1
CONT SPEC 4OZ CLIKSEAL STRL BL (MISCELLANEOUS) ×3 IMPLANT
DRAPE C-ARM 42X72 X-RAY (DRAPES) ×6 IMPLANT
DRAPE LAPAROTOMY 100X72 PEDS (DRAPES) ×3 IMPLANT
DRAPE MICROSCOPE LEICA (MISCELLANEOUS) ×3 IMPLANT
DRAPE POUCH INSTRU U-SHP 10X18 (DRAPES) ×3 IMPLANT
DRILL BIT (BIT) ×3 IMPLANT
DURAPREP 6ML APPLICATOR 50/CS (WOUND CARE) ×3 IMPLANT
ELECT COATED BLADE 2.86 ST (ELECTRODE) ×3 IMPLANT
ELECT REM PT RETURN 9FT ADLT (ELECTROSURGICAL) ×3
ELECTRODE REM PT RTRN 9FT ADLT (ELECTROSURGICAL) ×1 IMPLANT
GAUZE SPONGE 4X4 12PLY STRL (GAUZE/BANDAGES/DRESSINGS) ×3 IMPLANT
GAUZE SPONGE 4X4 16PLY XRAY LF (GAUZE/BANDAGES/DRESSINGS) IMPLANT
GLOVE BIOGEL PI IND STRL 7.0 (GLOVE) ×4 IMPLANT
GLOVE BIOGEL PI INDICATOR 7.0 (GLOVE) ×8
GLOVE ECLIPSE 6.5 STRL STRAW (GLOVE) ×3 IMPLANT
GLOVE ECLIPSE 9.0 STRL (GLOVE) ×6 IMPLANT
GLOVE EXAM NITRILE LRG STRL (GLOVE) IMPLANT
GLOVE EXAM NITRILE MD LF STRL (GLOVE) IMPLANT
GLOVE EXAM NITRILE XL STR (GLOVE) IMPLANT
GLOVE EXAM NITRILE XS STR PU (GLOVE) IMPLANT
GLOVE SS BIOGEL STRL SZ 6.5 (GLOVE) ×4 IMPLANT
GLOVE SUPERSENSE BIOGEL SZ 6.5 (GLOVE) ×8
GOWN STRL REUS W/ TWL LRG LVL3 (GOWN DISPOSABLE) ×2 IMPLANT
GOWN STRL REUS W/ TWL XL LVL3 (GOWN DISPOSABLE) ×2 IMPLANT
GOWN STRL REUS W/TWL 2XL LVL3 (GOWN DISPOSABLE) IMPLANT
GOWN STRL REUS W/TWL LRG LVL3 (GOWN DISPOSABLE) ×4
GOWN STRL REUS W/TWL XL LVL3 (GOWN DISPOSABLE) ×4
HALTER HD/CHIN CERV TRACTION D (MISCELLANEOUS) ×3 IMPLANT
HEMOSTAT SURGICEL 2X14 (HEMOSTASIS) IMPLANT
KIT BASIN OR (CUSTOM PROCEDURE TRAY) ×3 IMPLANT
KIT ROOM TURNOVER OR (KITS) ×3 IMPLANT
NEEDLE SPNL 20GX3.5 QUINCKE YW (NEEDLE) ×3 IMPLANT
NS IRRIG 1000ML POUR BTL (IV SOLUTION) ×3 IMPLANT
PACK LAMINECTOMY NEURO (CUSTOM PROCEDURE TRAY) ×3 IMPLANT
PAD ARMBOARD 7.5X6 YLW CONV (MISCELLANEOUS) ×9 IMPLANT
PEEK CAGE 7X14X11 (Cage) ×3 IMPLANT
PLATE ELITE 42MM (Plate) ×3 IMPLANT
RUBBERBAND STERILE (MISCELLANEOUS) ×6 IMPLANT
SCREW ST 13X4XST VA NS SPNE (Screw) ×6 IMPLANT
SCREW ST VAR 4 ATL (Screw) ×12 IMPLANT
SPONGE INTESTINAL PEANUT (DISPOSABLE) ×3 IMPLANT
SPONGE SURGIFOAM ABS GEL SZ50 (HEMOSTASIS) ×3 IMPLANT
STRIP CLOSURE SKIN 1/2X4 (GAUZE/BANDAGES/DRESSINGS) ×2 IMPLANT
SUT PDS AB 5-0 P3 18 (SUTURE) ×3 IMPLANT
SUT VIC AB 3-0 SH 8-18 (SUTURE) ×3 IMPLANT
SYR 20ML ECCENTRIC (SYRINGE) ×3 IMPLANT
TAPE CLOTH 4X10 WHT NS (GAUZE/BANDAGES/DRESSINGS) ×3 IMPLANT
TOWEL OR 17X24 6PK STRL BLUE (TOWEL DISPOSABLE) ×3 IMPLANT
TOWEL OR 17X26 10 PK STRL BLUE (TOWEL DISPOSABLE) ×3 IMPLANT
TRAP SPECIMEN MUCOUS 40CC (MISCELLANEOUS) ×3 IMPLANT
WATER STERILE IRR 1000ML POUR (IV SOLUTION) ×3 IMPLANT

## 2014-03-01 NOTE — Progress Notes (Signed)
Pt awakens to voice, follows commands, very sleepy but states "my neck hurts"

## 2014-03-01 NOTE — Op Note (Signed)
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  Surgeon:Omarie Parcell A.Daelon Dunivan, M.D.  Asst. Surgeon: none  Anesthesia: General  Indication:80 year old female with neck pain and bilateral upper extremities right greater than left. Workup demonstrates evidence of marked cervical spondylosis with stenosis and spinal cord compression and resultant spinal cord signal abnormality. Patient presents now for decompressive surgery in hopes of improving her symptoms.  Operative note:after induction of anesthesia, patient positioned supine with neck slightly extended and held in place of halter traction. Anterior cervical region prepped and draped sterilely. Incision made overlying C5. Dissection proceeded down to the platysma. Platysma was then divided vertically and dissection proceeded on the medial border of the sterile [place] muscle and carotid sheath. Trachea and esophagus mobilized and retracted towards the left.  Your fascia stripped off the anterior spinal column. Longus colli muscles elevated bilaterally using the large cautery. Retractor placed. For us be used. Levels confirmed. Disc spaces C4-5 and C5-6 incised with 15 blade. Discectomy performed using pituitary rongeurs forward and backward angle Carlen curettes Kerrison Rogers the high-speed drill. All elements the disc removed down to level posterior annulus. Microscope syndrome brought into the field and used throughout the remainder of the discectomy. Remaining aspects of annulus and osteophytes removed using high-speed drill down to the level of the posterior longitudinal ligament. Posterior lateral and was an elevated and resected  piecemeal fashion Kerrison Aundria Rudogers. A wide central decompression then performed by undercutting the bodies of C4 and C5. Decompression then proceeded out each neural foramen. Wide anterior foraminotomies were then performed on course exiting C5 nerve roots bilaterally. At this point a very thorough decompression had been achieved. There was no evidence of injury to the thecal sac or nerve roots. Gelfoam was placed topically for hemostasis. Procedure then repeated at C5-6 again without complication. All elements of spinal cord compression. To be relieved. Wound is then irrigated out like solution. Gelfoam was placed topically for hemostasis then removed. 6 mm Medtronic anatomic peek cage was packed with 1 locally harvested autograft and then impacted in place. 70 mm cage was then packed in place at C5-6. Cages were impacted so they were just slightly recessed from the anterior cortical margin of C4-5 and 6. 42 mm Atlantis anterior cervical plate was then placed over the C4, C5, C6 levels. This is an attachment of fluoroscopic guidance using 13 mm variable-angle screws to reach it all 3 levels. All 6 screws given a final tightening be solidly within the bone. Locking screws were engaged at all 3 levels. Final images revealed good position bone rasp R where proper upper level with normal alignment is fine. Wound is irrigated one final time. Hemostasis was assured with bipolar cautery. Wounds and close in layers with Vicryl sutures. Steri-Strips and sterile dressing were applied. No apparent complications. Patient tolerated the procedure well and she returns to the recovery room postop.

## 2014-03-01 NOTE — Progress Notes (Signed)
Orthopedic Tech Progress Note Patient Details:  Jillian [name] 05-23-1925 [id number] Applied in neuro PACU. Tolerated well.  Ortho Devices Type of Ortho Device: Soft collar Ortho Device/Splint Interventions: Application   [name] 03/01/2014, 10:42 AM

## 2014-03-01 NOTE — H&P (Signed)
Jillian [name] is an 80 y.o. female.   Chief Complaint: neck and arm pain HPI: 80 year old female with progressive neck pain with radiation in both upper extremities with associated weakness and sensory loss. Workup demonstrates evidence of marked cervical spondylosis and stenosis at C5-6 and C4-5. Patient presents now for two-level anterior cervical decompression infusion in hopes of improving her symptoms.  Past Medical History  Diagnosis Date  . Osteoporosis   . Ankle fracture, bimalleolar, closed   . Arthritis   . GERD (gastroesophageal reflux disease)   . Hyperlipemia   . Rectal prolapse     not treated yet  . Blood transfusion without reported diagnosis   . Complication of anesthesia     Takes longer time for patient to awake after anesthesia  . PONV (postoperative nausea and vomiting)   . Shortness of breath     when having arm pain  . Depression   . Difficulty swallowing     Past Surgical History  Procedure Laterality Date  . Hemorrhoid surgery    . Fracture surgery    . Tear duct probing Left 03/2011  . Orif ankle fracture  11/07/2011    Procedure: OPEN REDUCTION INTERNAL FIXATION (ORIF) ANKLE FRACTURE;  Surgeon: Jillian SchmidtJames P Aplington, MD;  Location: [facility] OR;  Service: Orthopedics;  Laterality: Left;  . Hardware removal  02/01/2012    Procedure: HARDWARE REMOVAL;  Surgeon: Jillian SchmidtJames P Aplington, MD;  [facility];  Service: Orthopedics;  Laterality: Left;  MIN C-ARM  left ankle hardware removal   . Colonoscopy    . Cataract extraction Right   . Toe surgery Left     little toe    Family History  Problem Relation Age of Onset  . Heart disease Mother   . Cancer Father     stomach  . Cancer Brother     stomach   Social History:  reports that she quit smoking about 70 years ago. She has never used smokeless tobacco. She reports that she does not drink alcohol or use illicit drugs.  Allergies:  Allergies  Allergen Reactions  . Doxycycline Nausea And  Vomiting  . Aspirin Other (See Comments)    GI upset  . Ibuprofen Other (See Comments)    GI bleed precautions  . Sulfa Antibiotics Hives    Medications Prior to Admission  Medication Sig Dispense Refill  . Calcium Carbonate (CALTRATE 600 PO) Take 1,200 mg by mouth daily.    Marland Kitchen. denosumab (PROLIA) 60 MG/ML SOLN injection Inject 60 mg into the skin every 6 (six) months. Administer in upper arm, thigh, or abdomen    . ergocalciferol (VITAMIN D2) 50000 UNITS capsule Take 50,000 Units by mouth once a week.    Marland Kitchen. HYDROcodone-acetaminophen (NORCO/VICODIN) 5-325 MG per tablet Take 1 tablet by mouth every 6 (six) hours as needed for moderate pain.    Marland Kitchen. omeprazole (PRILOSEC) 20 MG capsule Take 20 mg by mouth daily.     . ondansetron (ZOFRAN) 4 MG tablet Take 4 mg by mouth daily as needed for nausea or vomiting. Takes with pain meds    . pravastatin (PRAVACHOL) 20 MG tablet Take 20 mg by mouth daily.    . simethicone (MYLICON) 80 MG chewable tablet Chew 80 mg by mouth 4 (four) times daily as needed for flatulence.      No results found for this or any previous visit (from the past 48 hour(s)). No results found.  Review of Systems  Constitutional: Negative.  HENT: Negative.   Eyes: Negative.   Respiratory: Negative.   Cardiovascular: Negative.   Gastrointestinal: Negative.   Genitourinary: Negative.   Musculoskeletal: Negative.   Skin: Negative.   Neurological: Negative.   Endo/Heme/Allergies: Negative.   Psychiatric/Behavioral: Negative.     Blood pressure 137/68, pulse 55, temperature 96.7 F (35.9 C), resp. rate 18, weight 59.875 kg (132 lb), SpO2 100 %. Physical Exam  Constitutional: She is oriented to person, place, and time. She appears well-developed and well-nourished.  HENT:  Head: Normocephalic and atraumatic.  Right Ear: External ear normal.  Left Ear: External ear normal.  Nose: Nose normal.  Mouth/Throat: Oropharynx is clear and moist. No oropharyngeal exudate.  Eyes:  Conjunctivae and EOM are normal. Pupils are equal, round, and reactive to light. Right eye exhibits no discharge. Left eye exhibits no discharge.  Neck: Normal range of motion. Neck supple. No tracheal deviation present. No thyromegaly present.  Cardiovascular: Normal rate, regular rhythm, normal heart sounds and intact distal pulses.  Exam reveals no friction rub.   No murmur heard. Respiratory: Effort normal and breath sounds normal. No respiratory distress. She has no wheezes.  GI: Soft. Bowel sounds are normal. She exhibits no distension. There is no tenderness.  Musculoskeletal: Normal range of motion. She exhibits no edema or tenderness.  Neurological: She is alert and oriented to person, place, and time. She has normal reflexes. No cranial nerve deficit. Coordination normal.  Skin: Skin is warm and dry. No rash noted. No erythema. No pallor.  Psychiatric: She has a normal mood and affect. Her behavior is normal. Judgment and thought content normal.     Assessment/Plan C4-5, C5-6 spondylosis with stenosis and myelopathy. Plan C4-5 and C5-6 anterior cervical decompression and interbody fusion utilizing interbody peek cage, locally harvested autograft, and anterior plate instrumentation. Risks and benefits of been explained. Patient wishes to proceed.  Suzan Manon A 03/01/2014, 7:39 AM

## 2014-03-01 NOTE — Anesthesia Preprocedure Evaluation (Signed)
Anesthesia Evaluation  Patient identified by MRN, date of birth, ID band Patient awake    Reviewed: Allergy & Precautions, H&P , NPO status , Patient's Chart, lab work & pertinent test results  History of Anesthesia Complications (+) PONV  Airway Mallampati: II   Neck ROM: full    Dental   Pulmonary shortness of breath, former smoker,          Cardiovascular negative cardio ROS      Neuro/Psych Depression    GI/Hepatic GERD-  ,  Endo/Other    Renal/GU      Musculoskeletal  (+) Arthritis -,   Abdominal   Peds  Hematology   Anesthesia Other Findings   Reproductive/Obstetrics                             Anesthesia Physical Anesthesia Plan  ASA: II  Anesthesia Plan: General   Post-op Pain Management:    Induction: Intravenous  Airway Management Planned: Oral ETT  Additional Equipment:   Intra-op Plan:   Post-operative Plan: Extubation in OR  Informed Consent: I have reviewed the patients History and Physical, chart, labs and discussed the procedure including the risks, benefits and alternatives for the proposed anesthesia with the patient or authorized representative who has indicated his/her understanding and acceptance.     Plan Discussed with: CRNA, Anesthesiologist and Surgeon  Anesthesia Plan Comments:         Anesthesia Quick Evaluation

## 2014-03-01 NOTE — Brief Op Note (Signed)
03/01/2014  9:17 AM  PATIENT:  Jillian [name]  80 y.o. female  PRE-OPERATIVE DIAGNOSIS:  Cervical radiculopathy  POST-OPERATIVE DIAGNOSIS:  Cervical radiculopathy  PROCEDURE:  Procedure(s): Cervical four/five, five/six. Anterior cervical decompression/diskectomy/fusion (N/A)  SURGEON:  Surgeon(s) and Role:    * Temple PaciniHenry A Okey Zelek, MD - Primary  PHYSICIAN ASSISTANT:   ASSISTANTS: none   ANESTHESIA:   general  EBL:     BLOOD ADMINISTERED:none  DRAINS: none   LOCAL MEDICATIONS USED:  NONE  SPECIMEN:  No Specimen  DISPOSITION OF SPECIMEN:  N/A  COUNTS:  YES  TOURNIQUET:  * No tourniquets in log *  DICTATION: .Dragon Dictation  PLAN OF CARE: Admit to inpatient   PATIENT DISPOSITION:  PACU - hemodynamically stable.   Delay start of Pharmacological VTE agent (>24hrs) due to surgical blood loss or risk of bleeding: yes

## 2014-03-01 NOTE — Progress Notes (Signed)
Utilization review completed.  

## 2014-03-01 NOTE — Anesthesia Procedure Notes (Signed)
Procedure Name: Intubation Date/Time: 03/01/2014 7:46 AM Performed by: Jerilee HohMUMM, Amira Podolak N Pre-anesthesia Checklist: Patient identified, Emergency Drugs available, Suction available and Patient being monitored Patient Re-evaluated:Patient Re-evaluated prior to inductionOxygen Delivery Method: Circle system utilized Preoxygenation: Pre-oxygenation with 100% oxygen Intubation Type: IV induction Ventilation: Mask ventilation without difficulty Laryngoscope Size: Mac and 3 Grade View: Grade III Tube type: Oral Tube size: 7.0 mm Number of attempts: 1 Airway Equipment and Method: Stylet Placement Confirmation: ETT inserted through vocal cords under direct vision,  positive ETCO2 and breath sounds checked- equal and bilateral Secured at: 21 cm Tube secured with: Tape Dental Injury: Teeth and Oropharynx as per pre-operative assessment

## 2014-03-01 NOTE — Transfer of Care (Signed)
Immediate Anesthesia Transfer of Care Note  Patient: Jillian [name]  Procedure(s) Performed: Procedure(s): Cervical four/five, five/six. Anterior cervical decompression/diskectomy/fusion (N/A)  Patient Location: PACU  Anesthesia Type:General  Level of Consciousness: sedated and responds to stimulation  Airway & Oxygen Therapy: Patient Spontanous Breathing and Patient connected to nasal cannula oxygen  Post-op Assessment: Report given to PACU RN, Post -op Vital signs reviewed and stable and Patient moving all extremities  Post vital signs: Reviewed and stable  Complications: No apparent anesthesia complications

## 2014-03-01 NOTE — Anesthesia Postprocedure Evaluation (Signed)
Anesthesia Post Note  Patient: Jillian [name]  Procedure(s) Performed: Procedure(s) (LRB): Cervical four/five, five/six. Anterior cervical decompression/diskectomy/fusion (N/A)  Anesthesia type: General  Patient location: PACU  Post pain: Pain level controlled and Adequate analgesia  Post assessment: Post-op Vital signs reviewed, Patient's Cardiovascular Status Stable, Respiratory Function Stable, Patent Airway and Pain level controlled  Last Vitals:  Filed Vitals:   03/01/14 1045  BP:   Pulse: 52  Temp: 36.5 C  Resp: 12    Post vital signs: Reviewed and stable  Level of consciousness: awake, alert  and oriented  Complications: No apparent anesthesia complications

## 2014-03-02 DIAGNOSIS — M4712 Other spondylosis with myelopathy, cervical region: Secondary | ICD-10-CM | POA: Diagnosis not present

## 2014-03-02 MED ORDER — HYDROCODONE-ACETAMINOPHEN 5-325 MG PO TABS: 1.0000 | ORAL_TABLET | Freq: Four times a day (QID) | ORAL | Status: DC | PRN

## 2014-03-02 NOTE — Discharge Instructions (Signed)
Anterior Cervical Diskectomy and Fusion °Anterior cervical diskectomy is surgery done on the upper spine to relieve pressure on one or more nerve roots, or on the spinal cord. There are 7 bones in your neck, called the cervical spine. These 7 bones (vertebrae) sit one on top of the other. Cushions (intervertebral disks) separate the vertebrae and act like shock absorbers. As we age, degeneration of our bones, joints, and disks can cause neck pain and tightening around the spinal cord and nerve roots. This causes arm pain and weakness.  °Degeneration involves: °· Herniated Disk. With age, the disks dry up and can rupture. In this condition, the center of the disk bulges out (disk herniation). This can cause pressure on a nerve, which produces pain or weakness in the arm. °· Bone spurs and spinal stenosis. As we age, growths often develop on our bones. These growths are called bone spurs (osteophytes). A bone spur is a collection of calcium. As bone spurs grow and extend, the vertebral openings become narrow. The spinal canal and/or the foramen (opening for nerve passageways) become smaller. This narrowing (stenosis) may cause pinching (compression) of the spinal cord or the spinal nerve root. The nerve injury can cause pain, weakness, numbness, and loss of coordination in the upper limbs. Often, patients have difficulty with their hand writing or they start dropping things, because their hand grip is weaker. The spinal cord damage can cause increased stiffness, more frequent falls, electric shooting pain, and changes in bowel and bladder control. °Degeneration in the neck results in three common problems: °· Radiculopathy - Nerve compression that results in weakness or pain that radiates down the arm. °· Myelopathy - Spinal cord compression that causes stiffness, difficulty with walking, coordination, and trouble with bowel or bladder habits. °· Neck pain - Worn out joints cause pain as the neck  moves. °Treatment: °· Radiculopathy - Surgery is performed to remove the bony and disk material that is pushing on the nerve. °· Myelopathy - Surgery is performed to remove the bony and disk material that pushes on the spinal cord. °· Neck pain - Surgery is performed to combine (fuse) the joints of the neck together, so they cannot move or cause pain. °Surgery can be done from the front or the back of the neck. When it is done from the front, it is called an anterior (front) cervical (neck) diskectomy (removal of the disk) and fusion. °LET YOUR CAREGIVER KNOW ABOUT:  °· Recent infections. °· Any shooting pains down your leg, when you move your neck. °· Any difficulty swallowing. °· A smoking history. °· Use of blood thinners or anti-inflammatory medicines. °· Any history of injury to your shoulders. °· Any history of injury to your vocal cords. °· Any foreign objects in your body from a previous surgery. °· Any recent fevers or illness. °· Past medical history (diabetes, strokes). °· Past problems with anesthetics. °· Possibility of pregnancy. °· History of blood clots (deep vein thrombosis). °· History of bleeding or blood problems. °· Past surgeries. °· Other health problems. °· Allergies. °· Medicines you take, including herbs, eye drops, over-the-counter medicines, and creams. °· Use of steroids (by mouth or creams). °RISKS AND COMPLICATIONS °· Infection. °· Bleeding. °· Injury to the following structures: °¨ Carotid artery. This can result in a stroke or significant amount of bleeding. °¨ Esophagus, resulting in difficulty swallowing. °¨ Recurrent laryngeal nerve, resulting in hoarseness of the voice. °¨ Spinal cord injury, ranging from mild to complete quadriparesis (muscle weakness in   all four limbs).  Nerve root injury, resulting in muscle weakness in the upper limb.  Leakage of cerebrospinal fluid. BEFORE THE PROCEDURE   You will be given medicine to help you sleep (general anesthetic), and a  breathing tube will be placed.  You will be given antibiotics to keep the infection rate down.  The incision site on your neck will be marked.  Your neck will be cleaned, to reduce the risk of infection. PROCEDURE  An anterior cervical fusion means that the operation is done through the front (anterior) part of your neck. The cut made by the surgeon (incision) is usually within a skin fold line on the neck. After pushing aside the neck muscles, the surgeon removes the affected, degenerated disk and bone spurs (osteophytes), which takes the pressure off the nerves and spinal cord. This is called a decompression. The area where the disk was removed is then filled with a small piece of plastic. This plastic takes the place of the disk and keeps the nerve passageway (foramen) open and clear for the nerves. In most cases, the surgeon uses metal plates or pins (hardware) in the neck, to help stabilize the level being fused. The hardware reduces motion at that level, so it can fuse. This provides extra support to the neck. A cervical fusion procedure takes anywhere from a couple to several hours, depending on the size of the neck, history of previous surgery, and number of levels being fused. AFTER THE PROCEDURE   You will likely spend 24-48 hours in the hospital. During this time, your caregivers will look for any signs of complications from the procedure.  Your caregiver will watch you, to make sure that fluid draining from the surgery slows down. It is important that a large mass of blood does not form in your neck, which would cause difficulty with breathing.  You will get 24 hours of antibiotics.  You can start to eat as soon as you feel comfortable.  Once you have started eating, walking, urinating (voiding) and having bowel movements on your own, your caregiver will discharge you home. HOME CARE INSTRUCTIONS   For 2 weeks, do not soak the incision site under water. Do not swim or take baths.  Showers are okay, but rinse off the incision sites.  Do not over exert yourself. Allow time for the incision to heal.  It can take from 6 weeks to 6 months for fusion to take effect. Your caregiver may ask you to wear a neck collar during this time, as they check the fusion with multiple (serial) X-rays. Document Released: 04/04/2009 Document Revised: 08/11/2012 Document Reviewed: 04/04/2009 [facility] Patient Information 2015 [place], [place]. This information is not intended to replace advice given to you by your health care provider. Make sure you discuss any questions you have with your health care provider. Wound Care Keep incision area dry.  You may remove outer bandage after 2 days and shower.   If you shower prior cover incision with plastic wrap.  Do not put any creams, lotions, or ointments on incision. Leave steri-strips on neck.  They will fall off by themselves. Activity Walk each and every day, increasing distance each day. No lifting greater than 5 lbs.  Avoid excessive neck motion. No driving for 2 weeks; may ride as a passenger locally. Wear neck brace at all times except when showering. Diet Resume your normal diet.  Return to Work Will be discussed at you follow up appointment. Call Your Doctor If Any of  These Occur Redness, drainage, or swelling at the wound.  Temperature greater than 101 degrees. Severe pain not relieved by pain medication. Increased difficulty swallowing.  Incision starts to come apart. Follow Up Appt Call today and ask for Lurena JoinerRebecca for appointment in 1-2 weeks [phone]) or for problems.  If you have any hardware placed in your spine, you will need an x-ray before your appointment.    Wound Care Keep incision area dry.  You may remove outer bandage after 2 days and shower.   If you shower prior cover incision with plastic wrap.  Do not put any creams, lotions, or ointments on incision. Leave steri-strips on neck.  They will fall off by  themselves. Activity Walk each and every day, increasing distance each day. No lifting greater than 5 lbs.  Avoid excessive neck motion. No driving for 2 weeks; may ride as a passenger locally. Wear neck brace at all times except when showering. Diet Resume your normal diet.  Return to Work Will be discussed at you follow up appointment. Call Your Doctor If Any of These Occur Redness, drainage, or swelling at the wound.  Temperature greater than 101 degrees. Severe pain not relieved by pain medication. Increased difficulty swallowing.  Incision starts to come apart. Follow Up Appt Call today and ask for Lurena JoinerRebecca for appointment in 1-2 weeks [phone]) or for problems.  If you have any hardware placed in your spine, you will need an x-ray before your appointment.    Anterior Cervical Diskectomy and Fusion Anterior cervical diskectomy is surgery done on the upper spine to relieve pressure on one or more nerve roots, or on the spinal cord. There are 7 bones in your neck, called the cervical spine. These 7 bones (vertebrae) sit one on top of the other. Cushions (intervertebral disks) separate the vertebrae and act like shock absorbers. As we age, degeneration of our bones, joints, and disks can cause neck pain and tightening around the spinal cord and nerve roots. This causes arm pain and weakness.  Degeneration involves:  Herniated Disk. With age, the disks dry up and can rupture. In this condition, the center of the disk bulges out (disk herniation). This can cause pressure on a nerve, which produces pain or weakness in the arm.  Bone spurs and spinal stenosis. As we age, growths often develop on our bones. These growths are called bone spurs (osteophytes). A bone spur is a collection of calcium. As bone spurs grow and extend, the vertebral openings become narrow. The spinal canal and/or the foramen (opening for nerve passageways) become smaller. This narrowing (stenosis) may cause pinching  (compression) of the spinal cord or the spinal nerve root. The nerve injury can cause pain, weakness, numbness, and loss of coordination in the upper limbs. Often, patients have difficulty with their hand writing or they start dropping things, because their hand grip is weaker. The spinal cord damage can cause increased stiffness, more frequent falls, electric shooting pain, and changes in bowel and bladder control. Degeneration in the neck results in three common problems:  Radiculopathy - Nerve compression that results in weakness or pain that radiates down the arm.  Myelopathy - Spinal cord compression that causes stiffness, difficulty with walking, coordination, and trouble with bowel or bladder habits.  Neck pain - Worn out joints cause pain as the neck moves. Treatment:  Radiculopathy - Surgery is performed to remove the bony and disk material that is pushing on the nerve.  Myelopathy - Surgery is performed to remove  the bony and disk material that pushes on the spinal cord.  Neck pain - Surgery is performed to combine (fuse) the joints of the neck together, so they cannot move or cause pain. Surgery can be done from the front or the back of the neck. When it is done from the front, it is called an anterior (front) cervical (neck) diskectomy (removal of the disk) and fusion. LET YOUR CAREGIVER KNOW ABOUT:   Recent infections.  Any shooting pains down your leg, when you move your neck.  Any difficulty swallowing.  A smoking history.  Use of blood thinners or anti-inflammatory medicines.  Any history of injury to your shoulders.  Any history of injury to your vocal cords.  Any foreign objects in your body from a previous surgery.  Any recent fevers or illness.  Past medical history (diabetes, strokes).  Past problems with anesthetics.  Possibility of pregnancy.  History of blood clots (deep vein thrombosis).  History of bleeding or blood problems.  Past  surgeries.  Other health problems.  Allergies.  Medicines you take, including herbs, eye drops, over-the-counter medicines, and creams.  Use of steroids (by mouth or creams). RISKS AND COMPLICATIONS  Infection.  Bleeding.  Injury to the following structures:  Carotid artery. This can result in a stroke or significant amount of bleeding.  Esophagus, resulting in difficulty swallowing.  Recurrent laryngeal nerve, resulting in hoarseness of the voice.  Spinal cord injury, ranging from mild to complete quadriparesis (muscle weakness in all four limbs).  Nerve root injury, resulting in muscle weakness in the upper limb.  Leakage of cerebrospinal fluid. BEFORE THE PROCEDURE   You will be given medicine to help you sleep (general anesthetic), and a breathing tube will be placed.  You will be given antibiotics to keep the infection rate down.  The incision site on your neck will be marked.  Your neck will be cleaned, to reduce the risk of infection. PROCEDURE  An anterior cervical fusion means that the operation is done through the front (anterior) part of your neck. The cut made by the surgeon (incision) is usually within a skin fold line on the neck. After pushing aside the neck muscles, the surgeon removes the affected, degenerated disk and bone spurs (osteophytes), which takes the pressure off the nerves and spinal cord. This is called a decompression. The area where the disk was removed is then filled with a small piece of plastic. This plastic takes the place of the disk and keeps the nerve passageway (foramen) open and clear for the nerves. In most cases, the surgeon uses metal plates or pins (hardware) in the neck, to help stabilize the level being fused. The hardware reduces motion at that level, so it can fuse. This provides extra support to the neck. A cervical fusion procedure takes anywhere from a couple to several hours, depending on the size of the neck, history of  previous surgery, and number of levels being fused. AFTER THE PROCEDURE   You will likely spend 24-48 hours in the hospital. During this time, your caregivers will look for any signs of complications from the procedure.  Your caregiver will watch you, to make sure that fluid draining from the surgery slows down. It is important that a large mass of blood does not form in your neck, which would cause difficulty with breathing.  You will get 24 hours of antibiotics.  You can start to eat as soon as you feel comfortable.  Once you have started eating,  walking, urinating (voiding) and having bowel movements on your own, your caregiver will discharge you home. HOME CARE INSTRUCTIONS   For 2 weeks, do not soak the incision site under water. Do not swim or take baths. Showers are okay, but rinse off the incision sites.  Do not over exert yourself. Allow time for the incision to heal.  It can take from 6 weeks to 6 months for fusion to take effect. Your caregiver may ask you to wear a neck collar during this time, as they check the fusion with multiple (serial) X-rays. Document Released: 04/04/2009 Document Revised: 08/11/2012 Document Reviewed: 04/04/2009 [facility] Patient Information 2015 [place]. This information is not intended to replace advice given to you by your health care provider. Make sure you discuss any questions you have with your health care provider.

## 2014-03-02 NOTE — Plan of Care (Addendum)
Problem: Phase III Progression Outcomes Goal: Pain controlled on oral analgesia Outcome: Completed/Met Date Met:  03/02/14 Goal: Activity at appropriate level-compared to baseline  Outcome: Completed/Met Date Met:  03/02/14 Goal: Demonstrates donning/doffing brace Outcome: Completed/Met Date Met:  03/02/14 Goal: Demonstrates proper use of assistive devices Outcome: Completed/Met Date Met:  03/02/14     

## 2014-03-02 NOTE — Progress Notes (Signed)
Pt doing well. Pt and daughters given D/C instructions with Rx's, verbal understanding was provided. Pt's incision is clean and dry with no sign of infection. Pt's IV was removed prior to D/C. Pt D/C'd home via wheelchair @ 1145 per MD order. Pt is stable @ D/C and has no other needs at this time. Emelda BrothersAshley Fleetwood Pierron,RN

## 2014-03-02 NOTE — Discharge Summary (Signed)
Physician Discharge Summary  Patient ID: Jillian [name] I Storlie MRN: [id number] DOB/AGE: 09-12-1925 80 y.o.  Admit date: 03/01/2014 Discharge date: 03/02/2014  Admission Diagnoses:  Discharge Diagnoses:  Active Problems:   Cervical myelopathy   Discharged Condition: good  Hospital Course: the patient was admitted to the hospital where she underwent an uncomplicated two-level anterior cervical decompression and fusion. Postoperative she is doing very well. Preoperative neck and upper extremity pain and numbness much improved. Patient up ambulating. Ready for discharge home.  Consults:   Significant Diagnostic Studies:   Treatments:   Discharge Exam: Blood pressure 127/70, pulse 57, temperature 98.2 F (36.8 C), resp. rate 18, weight 59.875 kg (132 lb), SpO2 99 %. Awake and alert. Oriented and appropriate. Motor and sensory function intact. Wound clean and dry. Chest and abdomen benign.  Disposition: 01-Home or Self Care     Medication List    TAKE these medications        CALTRATE 600 PO  Take 1,200 mg by mouth daily.     denosumab 60 MG/ML Soln injection  Commonly known as:  PROLIA  Inject 60 mg into the skin every 6 (six) months. Administer in upper arm, thigh, or abdomen     ergocalciferol 50000 UNITS capsule  Commonly known as:  VITAMIN D2  Take 50,000 Units by mouth once a week.     HYDROcodone-acetaminophen 5-325 MG per tablet  Commonly known as:  NORCO/VICODIN  Take 1-2 tablets by mouth every 6 (six) hours as needed for moderate pain.     omeprazole 20 MG capsule  Commonly known as:  PRILOSEC  Take 20 mg by mouth daily.     ondansetron 4 MG tablet  Commonly known as:  ZOFRAN  Take 4 mg by mouth daily as needed for nausea or vomiting. Takes with pain meds     pravastatin 20 MG tablet  Commonly known as:  PRAVACHOL  Take 20 mg by mouth daily.     simethicone 80 MG chewable tablet  Commonly known as:  MYLICON  Chew 80 mg by mouth 4 (four) times daily as  needed for flatulence.           Follow-up Information    Follow up with Temple PaciniPOOL,Jaylynn Siefert A, MD.   Specialty:  Neurosurgery   Contact information:   [address] [place] [address] [phone]       Signed: Karinna Beadles A 03/02/2014, 10:34 AM

## 2014-03-03 ENCOUNTER — Encounter (HOSPITAL_COMMUNITY): Payer: Self-pay | Admitting: Neurosurgery

## 2014-03-23 ENCOUNTER — Other Ambulatory Visit: Payer: Self-pay

## 2014-03-23 DIAGNOSIS — Z1231 Encounter for screening mammogram for malignant neoplasm of breast: Secondary | ICD-10-CM

## 2014-03-28 ENCOUNTER — Emergency Department (HOSPITAL_COMMUNITY)
Admission: EM | Admit: 2014-03-28 | Discharge: 2014-03-29 | Disposition: A | Discharge status: Home / Self Care | Payer: Medicare HMO | Attending: Emergency Medicine | Admitting: Emergency Medicine

## 2014-03-28 ENCOUNTER — Encounter (HOSPITAL_COMMUNITY): Payer: Self-pay

## 2014-03-28 ENCOUNTER — Emergency Department (HOSPITAL_COMMUNITY): Payer: Medicare HMO

## 2014-03-28 DIAGNOSIS — Z87828 Personal history of other (healed) physical injury and trauma: Secondary | ICD-10-CM | POA: Diagnosis not present

## 2014-03-28 DIAGNOSIS — E785 Hyperlipidemia, unspecified: Secondary | ICD-10-CM | POA: Insufficient documentation

## 2014-03-28 DIAGNOSIS — Z87891 Personal history of nicotine dependence: Secondary | ICD-10-CM | POA: Diagnosis not present

## 2014-03-28 DIAGNOSIS — K219 Gastro-esophageal reflux disease without esophagitis: Secondary | ICD-10-CM | POA: Insufficient documentation

## 2014-03-28 DIAGNOSIS — M1990 Unspecified osteoarthritis, unspecified site: Secondary | ICD-10-CM | POA: Insufficient documentation

## 2014-03-28 DIAGNOSIS — Z79899 Other long term (current) drug therapy: Secondary | ICD-10-CM | POA: Insufficient documentation

## 2014-03-28 DIAGNOSIS — K529 Noninfective gastroenteritis and colitis, unspecified: Secondary | ICD-10-CM

## 2014-03-28 DIAGNOSIS — M810 Age-related osteoporosis without current pathological fracture: Secondary | ICD-10-CM | POA: Insufficient documentation

## 2014-03-28 DIAGNOSIS — Z8781 Personal history of (healed) traumatic fracture: Secondary | ICD-10-CM | POA: Diagnosis not present

## 2014-03-28 DIAGNOSIS — R1084 Generalized abdominal pain: Secondary | ICD-10-CM

## 2014-03-28 DIAGNOSIS — R109 Unspecified abdominal pain: Secondary | ICD-10-CM

## 2014-03-28 DIAGNOSIS — Z8659 Personal history of other mental and behavioral disorders: Secondary | ICD-10-CM | POA: Diagnosis not present

## 2014-03-28 LAB — URINALYSIS, ROUTINE W REFLEX MICROSCOPIC
Bilirubin Urine: NEGATIVE
Glucose, UA: NEGATIVE mg/dL
Hgb urine dipstick: NEGATIVE
Ketones, ur: NEGATIVE mg/dL
LEUKOCYTES UA: NEGATIVE
NITRITE: NEGATIVE
PH: 7.5 (ref 5.0–8.0)
Protein, ur: NEGATIVE mg/dL
SPECIFIC GRAVITY, URINE: 1.014 (ref 1.005–1.030)
Urobilinogen, UA: 0.2 mg/dL (ref 0.0–1.0)

## 2014-03-28 LAB — CBC WITH DIFFERENTIAL/PLATELET
Basophils Absolute: 0 10*3/uL (ref 0.0–0.1)
Basophils Relative: 0 % (ref 0–1)
Eosinophils Absolute: 0.1 10*3/uL (ref 0.0–0.7)
Eosinophils Relative: 2 % (ref 0–5)
HCT: 37.1 % (ref 36.0–46.0)
HEMOGLOBIN: 12.7 g/dL (ref 12.0–15.0)
LYMPHS ABS: 0.6 10*3/uL — AB (ref 0.7–4.0)
Lymphocytes Relative: 17 % (ref 12–46)
MCH: 32.5 pg (ref 26.0–34.0)
MCHC: 34.2 g/dL (ref 30.0–36.0)
MCV: 94.9 fL (ref 78.0–100.0)
MONOS PCT: 7 % (ref 3–12)
Monocytes Absolute: 0.3 10*3/uL (ref 0.1–1.0)
NEUTROS PCT: 74 % (ref 43–77)
Neutro Abs: 2.7 10*3/uL (ref 1.7–7.7)
Platelets: 109 10*3/uL — ABNORMAL LOW (ref 150–400)
RBC: 3.91 MIL/uL (ref 3.87–5.11)
RDW: 13.1 % (ref 11.5–15.5)
WBC: 3.6 10*3/uL — ABNORMAL LOW (ref 4.0–10.5)

## 2014-03-28 LAB — COMPREHENSIVE METABOLIC PANEL
ALT: 19 U/L (ref 0–35)
ANION GAP: 11 (ref 5–15)
AST: 34 U/L (ref 0–37)
Albumin: 3.3 g/dL — ABNORMAL LOW (ref 3.5–5.2)
Alkaline Phosphatase: 64 U/L (ref 39–117)
BILIRUBIN TOTAL: 0.6 mg/dL (ref 0.3–1.2)
BUN: 11 mg/dL (ref 6–23)
CO2: 25 mEq/L (ref 19–32)
Calcium: 8.7 mg/dL (ref 8.4–10.5)
Chloride: 101 mEq/L (ref 96–112)
Creatinine, Ser: 0.67 mg/dL (ref 0.50–1.10)
GFR calc Af Amer: 90 mL/min — ABNORMAL LOW (ref >90)
GFR calc non Af Amer: 77 mL/min — ABNORMAL LOW (ref >90)
Glucose, Bld: 112 mg/dL — ABNORMAL HIGH (ref 70–99)
POTASSIUM: 4 meq/L (ref 3.7–5.3)
Sodium: 137 mEq/L (ref 137–147)
Total Protein: 6.3 g/dL (ref 6.0–8.3)

## 2014-03-28 MED ORDER — METRONIDAZOLE 50 MG/ML ORAL SUSPENSION: 500.0000 mg | Freq: Three times a day (TID) | ORAL | Status: AC

## 2014-03-28 MED ORDER — CIPROFLOXACIN 500 MG/5ML (10%) PO SUSR: 500.0000 mg | Freq: Two times a day (BID) | ORAL | Status: AC

## 2014-03-28 MED ORDER — FENTANYL CITRATE 0.05 MG/ML IJ SOLN
50.0000 ug | Freq: Once | INTRAMUSCULAR | Status: AC
  Administered 2014-03-28: 50 ug via INTRAVENOUS
  Filled 2014-03-28: qty 2

## 2014-03-28 MED ORDER — IOHEXOL 300 MG/ML  SOLN
100.0000 mL | Freq: Once | INTRAMUSCULAR | Status: AC | PRN
  Administered 2014-03-28: 100 mL via INTRAVENOUS

## 2014-03-28 NOTE — Discharge Instructions (Signed)

## 2014-03-28 NOTE — ED Provider Notes (Signed)
CSN: [id number]     Arrival date & time 03/28/14  2000 History   First MD Initiated Contact with Patient 03/28/14 2000     Chief Complaint  Patient presents with  . Abdominal Pain     (Consider location/radiation/quality/duration/timing/severity/associated sxs/prior Treatment) HPI Comments: Patient presents to the emergency department with chief complaint of abdominal pain. She states that the pain started today. She describes pain is diffuse and cramping in nature. She reports associated diarrhea. Additionally, patient states that she has had nausea 1 week. She also states that it is difficult to swallow. The symptoms have been present since having a anterior cervical spine procedure. She is followed up with her primary care physician regarding the symptoms, who tell her that the symptoms will continue to improve. She states that it is the abdominal pain that brought her to the emergency department today. She denies any fevers chills. She denies any chest pain or shortness of breath. There are no aggravating or relieving factors.  The history is provided by the patient. No language interpreter was used.    Past Medical History  Diagnosis Date  . Osteoporosis   . Ankle fracture, bimalleolar, closed   . Arthritis   . GERD (gastroesophageal reflux disease)   . Hyperlipemia   . Rectal prolapse     not treated yet  . Blood transfusion without reported diagnosis   . Complication of anesthesia     Takes longer time for patient to awake after anesthesia  . PONV (postoperative nausea and vomiting)   . Shortness of breath     when having arm pain  . Depression   . Difficulty swallowing    Past Surgical History  Procedure Laterality Date  . Hemorrhoid surgery    . Fracture surgery    . Tear duct probing Left 03/2011  . Orif ankle fracture  11/07/2011    Procedure: OPEN REDUCTION INTERNAL FIXATION (ORIF) ANKLE FRACTURE;  Surgeon: Drucilla SchmidtJames P Aplington, MD;  Location: [facility];  Service:  Orthopedics;  Laterality: Left;  . Hardware removal  02/01/2012    Procedure: HARDWARE REMOVAL;  Surgeon: Drucilla SchmidtJames P Aplington, MD;  [facility] [place];  Service: Orthopedics;  Laterality: Left;  MIN C-ARM  left ankle hardware removal   . Colonoscopy    . Cataract extraction Right   . Toe surgery Left     little toe  . Anterior cervical decomp/discectomy fusion N/A 03/01/2014    Procedure: Cervical four/five, five/six. Anterior cervical decompression/diskectomy/fusion;  Surgeon: Temple PaciniHenry A Pool, MD;  Location: [facility];  Service: Neurosurgery;  Laterality: N/A;   Family History  Problem Relation Age of Onset  . Heart disease Mother   . Cancer Father     stomach  . Cancer Brother     stomach   History  Substance Use Topics  . Smoking status: Former Smoker    Quit date: 05/01/1943  . Smokeless tobacco: Never Used  . Alcohol Use: No   OB History    No data available     Review of Systems  Constitutional: Negative for fever and chills.  Respiratory: Negative for shortness of breath.   Cardiovascular: Negative for chest pain.  Gastrointestinal: Positive for nausea, abdominal pain and diarrhea. Negative for vomiting and constipation.  Genitourinary: Negative for dysuria.  All other systems reviewed and are negative.     Allergies  Doxycycline; Aspirin; Ibuprofen; and Sulfa antibiotics  Home Medications   Prior to Admission medications   Medication Sig Start Date  End Date Taking? Authorizing Provider  Calcium Carbonate (CALTRATE 600 PO) Take 1,200 mg by mouth daily.    Historical Provider, MD  denosumab (PROLIA) 60 MG/ML SOLN injection Inject 60 mg into the skin every 6 (six) months. Administer in upper arm, thigh, or abdomen    Historical Provider, MD  ergocalciferol (VITAMIN D2) 50000 UNITS capsule Take 50,000 Units by mouth once a week.    Historical Provider, MD  HYDROcodone-acetaminophen (NORCO/VICODIN) 5-325 MG per tablet Take 1-2 tablets by mouth  every 6 (six) hours as needed for moderate pain. 03/02/14   Temple Pacini, MD  omeprazole (PRILOSEC) 20 MG capsule Take 20 mg by mouth daily.  08/03/13   Historical Provider, MD  ondansetron (ZOFRAN) 4 MG tablet Take 4 mg by mouth daily as needed for nausea or vomiting. Takes with pain meds    Historical Provider, MD  pravastatin (PRAVACHOL) 20 MG tablet Take 20 mg by mouth daily.    Historical Provider, MD  simethicone (MYLICON) 80 MG chewable tablet Chew 80 mg by mouth 4 (four) times daily as needed for flatulence.    Historical Provider, MD   BP 149/74 mmHg  Pulse 56  Temp(Src) 97.8 F (36.6 C) (Oral)  Resp 18  SpO2 100% Physical Exam  Constitutional: She is oriented to person, place, and time. She appears well-developed and well-nourished.  HENT:  Head: Normocephalic and atraumatic.  Eyes: Conjunctivae and EOM are normal. Pupils are equal, round, and reactive to light.  Neck: Normal range of motion. Neck supple.  Cardiovascular: Normal rate and regular rhythm.  Exam reveals no gallop and no friction rub.   No murmur heard. Pulmonary/Chest: Effort normal and breath sounds normal. No respiratory distress. She has no wheezes. She has no rales. She exhibits no tenderness.  Abdominal: Soft. Bowel sounds are normal. She exhibits no distension and no mass. There is no tenderness. There is no rebound and no guarding.  Abdomen is diffusely uncomfortable to palpation, but no true focal abdominal tenderness  Genitourinary:  Rectal exam remarkable for soft brown stool, no gross blood or hemorrhage  Chaperone present  Musculoskeletal: Normal range of motion. She exhibits no edema or tenderness.  Neurological: She is alert and oriented to person, place, and time.  Skin: Skin is warm and dry.  Psychiatric: She has a normal mood and affect. Her behavior is normal. Judgment and thought content normal.  Nursing note and vitals reviewed.   ED Course  Procedures (including critical care time) Results  for orders placed or performed during the hospital encounter of 03/28/14  Urinalysis, Routine w reflex microscopic  Result Value Ref Range   Color, Urine YELLOW YELLOW   APPearance CLOUDY (A) CLEAR   Specific Gravity, Urine 1.014 1.005 - 1.030   pH 7.5 5.0 - 8.0   Glucose, UA NEGATIVE NEGATIVE mg/dL   Hgb urine dipstick NEGATIVE NEGATIVE   Bilirubin Urine NEGATIVE NEGATIVE   Ketones, ur NEGATIVE NEGATIVE mg/dL   Protein, ur NEGATIVE NEGATIVE mg/dL   Urobilinogen, UA 0.2 0.0 - 1.0 mg/dL   Nitrite NEGATIVE NEGATIVE   Leukocytes, UA NEGATIVE NEGATIVE  CBC with Differential  Result Value Ref Range   WBC 3.6 (L) 4.0 - 10.5 K/uL   RBC 3.91 3.87 - 5.11 MIL/uL   Hemoglobin 12.7 12.0 - 15.0 g/dL   HCT 16.1 09.6 - 04.5 %   MCV 94.9 78.0 - 100.0 fL   MCH 32.5 26.0 - 34.0 pg   MCHC 34.2 30.0 - 36.0 g/dL  RDW 13.1 11.5 - 15.5 %   Platelets 109 (L) 150 - 400 K/uL   Neutrophils Relative % 74 43 - 77 %   Neutro Abs 2.7 1.7 - 7.7 K/uL   Lymphocytes Relative 17 12 - 46 %   Lymphs Abs 0.6 (L) 0.7 - 4.0 K/uL   Monocytes Relative 7 3 - 12 %   Monocytes Absolute 0.3 0.1 - 1.0 K/uL   Eosinophils Relative 2 0 - 5 %   Eosinophils Absolute 0.1 0.0 - 0.7 K/uL   Basophils Relative 0 0 - 1 %   Basophils Absolute 0.0 0.0 - 0.1 K/uL  Comprehensive metabolic panel  Result Value Ref Range   Sodium 137 137 - 147 mEq/L   Potassium 4.0 3.7 - 5.3 mEq/L   Chloride 101 96 - 112 mEq/L   CO2 25 19 - 32 mEq/L   Glucose, Bld 112 (H) 70 - 99 mg/dL   BUN 11 6 - 23 mg/dL   Creatinine, Ser 1.610.67 0.50 - 1.10 mg/dL   Calcium 8.7 8.4 - 09.610.5 mg/dL   Total Protein 6.3 6.0 - 8.3 g/dL   Albumin 3.3 (L) 3.5 - 5.2 g/dL   AST 34 0 - 37 U/L   ALT 19 0 - 35 U/L   Alkaline Phosphatase 64 39 - 117 U/L   Total Bilirubin 0.6 0.3 - 1.2 mg/dL   GFR calc non Af Amer 77 (L) >90 mL/min   GFR calc Af Amer 90 (L) >90 mL/min   Anion gap 11 5 - 15   Dg Cervical Spine 1 View  03/01/2014   CLINICAL DATA:  ACDF C4-C6.  Initial  encounter.  EXAM: DG CERVICAL SPINE - 1 VIEW; DG C-ARM 61-120 MIN  COMPARISON:  Cervical spine MRI - 02/10/2014  FINDINGS: A single spot intraoperative radiographic image of the cervical spine is provided for review.  Image demonstrates the sequela of C4-C6 ACDF and intervertebral disc space replacement. Alignment appears anatomic. No evidence of hardware failure or loosening. Enteric tube side port overlies the tracheal air column with tip excluded from view.  IMPRESSION: Post C4-C6 ACDF without evidence of complication.   Electronically Signed   By: Simonne ComeJohn  Watts M.D.   On: 03/01/2014 09:51   Ct Abdomen Pelvis W Contrast  03/28/2014   CLINICAL DATA:  80 year old female with abdominal and pelvic pain, nausea, vomiting and diarrhea.  EXAM: CT ABDOMEN AND PELVIS WITH CONTRAST  TECHNIQUE: Multidetector CT imaging of the abdomen and pelvis was performed using the standard protocol following bolus administration of intravenous contrast.  CONTRAST:  100mL OMNIPAQUE IOHEXOL 300 MG/ML  SOLN  COMPARISON:  None.  FINDINGS: Cardiomegaly identified.  There is mild circumferential wall thickening of the transverse and descending colon compatible with a colitis, likely inflammatory or infectious.  The visualized celiac artery, SMA, IMA and SMV are patent.  There is no evidence of bowel obstruction, pneumoperitoneum or abscess.  The slightly nodular hepatic contour is noted -question cirrhosis.  The spleen, pancreas, adrenal glands and gallbladder are unremarkable.  Bilateral renal cortical atrophy, right renal cyst and duplicated left renal collecting system noted.  There is no evidence of free fluid, enlarged lymph nodes, biliary dilation or abdominal aortic aneurysm.  No acute or suspicious bony abnormalities are noted. Degenerative changes in both hips and lumbar spine noted. A right hip effusion is noted and likely chronic.  IMPRESSION: Transverse and descending colitis -likely inflammatory or infectious. No evidence of  bowel obstruction, pneumoperitoneum or abscess.  Slightly nodular hepatic  contour -question cirrhosis.  Degenerative changes within the lumbar spine and bilateral hips with right hip effusion, likely chronic.  Cardiomegaly.   Electronically Signed   By: Laveda Abbe M.D.   On: 03/28/2014 23:06   Dg C-arm 1-60 Min  03/01/2014   CLINICAL DATA:  ACDF C4-C6.  Initial encounter.  EXAM: DG CERVICAL SPINE - 1 VIEW; DG C-ARM 61-120 MIN  COMPARISON:  Cervical spine MRI - 02/10/2014  FINDINGS: A single spot intraoperative radiographic image of the cervical spine is provided for review.  Image demonstrates the sequela of C4-C6 ACDF and intervertebral disc space replacement. Alignment appears anatomic. No evidence of hardware failure or loosening. Enteric tube side port overlies the tracheal air column with tip excluded from view.  IMPRESSION: Post C4-C6 ACDF without evidence of complication.   Electronically Signed   By: Simonne Come M.D.   On: 03/01/2014 09:51      EKG Interpretation None      MDM   Final diagnoses:  Abdominal pain  Colitis    Patient with abdominal pain, also complaining of nausea 1 week and diarrhea that started today. No fevers or chills. Will check labs, and CT abdomen.   CT remarkable for transverse and descending colitis.  FOBT positive, but no gross blood, no hemorrhage. Patient is tolerating oral intake.  No vomiting.  No fevers.  Patient has daughters that take good care of her at home.  I feel that she would be appropriate for discharge to home with outpatient follow-up.  Patient encouraged to return if symptoms worsen, she runs a fever, or has increased pain.  Patient and daughter understand and agree with the plan.  Patient is non-toxic appearing.   11:55 PM Patient seen by and discussed with Dr. Juleen China, who agrees with the plan.  Roxy Horseman, PA-C 03/29/14 1610  Raeford Razor, MD 03/31/14 [phone]

## 2014-03-28 NOTE — ED Notes (Signed)
Pt given ginger ale, crackers, and peanut butter.  

## 2014-03-28 NOTE — ED Notes (Signed)
Pt assisted to bathroom by family member.

## 2014-03-28 NOTE — ED Notes (Signed)
PT TO CT

## 2014-03-28 NOTE — ED Notes (Signed)
Per EMS, Patient started to have nausea per one week, but abdominal pain started to happen about one hour ago. Patient denies vomiting. Patient describes pain as cramping and diffuse. Patient had an anterior cervical procedure done about a month ago where patient's esophagus was shifted. Since this time, patient has had trouble swallowing which has affected appetite and caused her to become nauseous. Patient's physician is aware of the trouble swallowing. Vitals per EMS; 152/72, 56 HR, 100 % on RA. 120 CBG. 12 LEad Unremarkable. 20 G LForearm. Patient given 4 mg of Zofran with relief of nausea.

## 2014-03-29 LAB — POC OCCULT BLOOD, ED: Fecal Occult Bld: POSITIVE — AB

## 2014-03-29 MED ORDER — HYDROCODONE-ACETAMINOPHEN 7.5-325 MG/15ML PO SOLN: 15.0000 mL | Freq: Four times a day (QID) | ORAL | Status: AC | PRN

## 2014-03-29 MED ORDER — ONDANSETRON 4 MG PO TBDP: 4.0000 mg | ORAL_TABLET | Freq: Three times a day (TID) | ORAL | Status: AC | PRN

## 2014-03-29 NOTE — ED Notes (Signed)
Pt was able to keep ginger ale and crackers with no vomiting.

## 2014-03-29 NOTE — ED Notes (Signed)
Dr. Kohut at bedside 

## 2014-03-29 NOTE — ED Notes (Addendum)
Discharge instructions reviewed with pt and family member. Pt verbalized understanding.  

## 2014-04-02 ENCOUNTER — Encounter (HOSPITAL_COMMUNITY): Payer: Self-pay

## 2014-04-02 ENCOUNTER — Emergency Department (HOSPITAL_COMMUNITY)
Admission: EM | Admit: 2014-04-02 | Discharge: 2014-04-02 | Disposition: A | Discharge status: Home / Self Care | Payer: Medicare HMO | Attending: Emergency Medicine | Admitting: Emergency Medicine

## 2014-04-02 ENCOUNTER — Emergency Department (HOSPITAL_COMMUNITY): Payer: Medicare HMO

## 2014-04-02 ENCOUNTER — Other Ambulatory Visit: Payer: Self-pay

## 2014-04-02 DIAGNOSIS — K219 Gastro-esophageal reflux disease without esophagitis: Secondary | ICD-10-CM | POA: Insufficient documentation

## 2014-04-02 DIAGNOSIS — M1990 Unspecified osteoarthritis, unspecified site: Secondary | ICD-10-CM | POA: Diagnosis not present

## 2014-04-02 DIAGNOSIS — M810 Age-related osteoporosis without current pathological fracture: Secondary | ICD-10-CM | POA: Insufficient documentation

## 2014-04-02 DIAGNOSIS — Z862 Personal history of diseases of the blood and blood-forming organs and certain disorders involving the immune mechanism: Secondary | ICD-10-CM | POA: Insufficient documentation

## 2014-04-02 DIAGNOSIS — Z8659 Personal history of other mental and behavioral disorders: Secondary | ICD-10-CM | POA: Diagnosis not present

## 2014-04-02 DIAGNOSIS — E785 Hyperlipidemia, unspecified: Secondary | ICD-10-CM | POA: Diagnosis not present

## 2014-04-02 DIAGNOSIS — Z79899 Other long term (current) drug therapy: Secondary | ICD-10-CM | POA: Diagnosis not present

## 2014-04-02 DIAGNOSIS — R109 Unspecified abdominal pain: Secondary | ICD-10-CM | POA: Diagnosis present

## 2014-04-02 DIAGNOSIS — IMO0001 Reserved for inherently not codable concepts without codable children: Secondary | ICD-10-CM

## 2014-04-02 DIAGNOSIS — Z87891 Personal history of nicotine dependence: Secondary | ICD-10-CM | POA: Diagnosis not present

## 2014-04-02 DIAGNOSIS — N390 Urinary tract infection, site not specified: Secondary | ICD-10-CM | POA: Diagnosis not present

## 2014-04-02 DIAGNOSIS — Z8781 Personal history of (healed) traumatic fracture: Secondary | ICD-10-CM | POA: Diagnosis not present

## 2014-04-02 DIAGNOSIS — R110 Nausea: Secondary | ICD-10-CM

## 2014-04-02 DIAGNOSIS — Z792 Long term (current) use of antibiotics: Secondary | ICD-10-CM | POA: Diagnosis not present

## 2014-04-02 DIAGNOSIS — R1310 Dysphagia, unspecified: Secondary | ICD-10-CM

## 2014-04-02 DIAGNOSIS — R1013 Epigastric pain: Secondary | ICD-10-CM

## 2014-04-02 LAB — CBC WITH DIFFERENTIAL/PLATELET
Basophils Absolute: 0 10*3/uL (ref 0.0–0.1)
Basophils Relative: 0 % (ref 0–1)
EOS ABS: 0.1 10*3/uL (ref 0.0–0.7)
Eosinophils Relative: 2 % (ref 0–5)
HEMATOCRIT: 37.5 % (ref 36.0–46.0)
HEMOGLOBIN: 12.7 g/dL (ref 12.0–15.0)
Lymphocytes Relative: 22 % (ref 12–46)
Lymphs Abs: 0.5 10*3/uL — ABNORMAL LOW (ref 0.7–4.0)
MCH: 31.2 pg (ref 26.0–34.0)
MCHC: 33.9 g/dL (ref 30.0–36.0)
MCV: 92.1 fL (ref 78.0–100.0)
MONO ABS: 0.3 10*3/uL (ref 0.1–1.0)
MONOS PCT: 13 % — AB (ref 3–12)
NEUTROS PCT: 63 % (ref 43–77)
Neutro Abs: 1.6 10*3/uL — ABNORMAL LOW (ref 1.7–7.7)
Platelets: 106 10*3/uL — ABNORMAL LOW (ref 150–400)
RBC: 4.07 MIL/uL (ref 3.87–5.11)
RDW: 13 % (ref 11.5–15.5)
WBC: 2.5 10*3/uL — ABNORMAL LOW (ref 4.0–10.5)

## 2014-04-02 LAB — URINALYSIS, ROUTINE W REFLEX MICROSCOPIC
Bilirubin Urine: NEGATIVE
Glucose, UA: NEGATIVE mg/dL
Hgb urine dipstick: NEGATIVE
Ketones, ur: >80 mg/dL — AB
Nitrite: NEGATIVE
PROTEIN: NEGATIVE mg/dL
SPECIFIC GRAVITY, URINE: 1.008 (ref 1.005–1.030)
UROBILINOGEN UA: 0.2 mg/dL (ref 0.0–1.0)
pH: 6 (ref 5.0–8.0)

## 2014-04-02 LAB — COMPREHENSIVE METABOLIC PANEL
ALT: 21 U/L (ref 0–35)
ANION GAP: 19 — AB (ref 5–15)
AST: 56 U/L — ABNORMAL HIGH (ref 0–37)
Albumin: 3.3 g/dL — ABNORMAL LOW (ref 3.5–5.2)
Alkaline Phosphatase: 49 U/L (ref 39–117)
BUN: 8 mg/dL (ref 6–23)
CO2: 21 mEq/L (ref 19–32)
CREATININE: 0.67 mg/dL (ref 0.50–1.10)
Calcium: 9.1 mg/dL (ref 8.4–10.5)
Chloride: 98 mEq/L (ref 96–112)
GFR calc non Af Amer: 77 mL/min — ABNORMAL LOW (ref >90)
GFR, EST AFRICAN AMERICAN: 90 mL/min — AB (ref >90)
GLUCOSE: 76 mg/dL (ref 70–99)
Potassium: 3.7 mEq/L (ref 3.7–5.3)
Sodium: 138 mEq/L (ref 137–147)
TOTAL PROTEIN: 6.2 g/dL (ref 6.0–8.3)
Total Bilirubin: 0.8 mg/dL (ref 0.3–1.2)

## 2014-04-02 LAB — URINE MICROSCOPIC-ADD ON

## 2014-04-02 LAB — I-STAT TROPONIN, ED: Troponin i, poc: 0.01 ng/mL (ref 0.00–0.08)

## 2014-04-02 LAB — LIPASE, BLOOD: LIPASE: 35 U/L (ref 11–59)

## 2014-04-02 MED ORDER — DEXTROSE 5 % IV SOLN
1.0000 g | Freq: Once | INTRAVENOUS | Status: AC
  Administered 2014-04-02: 1 g via INTRAVENOUS

## 2014-04-02 MED ORDER — FAMOTIDINE IN NACL 20-0.9 MG/50ML-% IV SOLN
20.0000 mg | Freq: Once | INTRAVENOUS | Status: AC
  Administered 2014-04-02: 20 mg via INTRAVENOUS

## 2014-04-02 MED ORDER — METOCLOPRAMIDE HCL 5 MG/ML IJ SOLN
10.0000 mg | Freq: Once | INTRAMUSCULAR | Status: AC
  Administered 2014-04-02: 10 mg via INTRAVENOUS

## 2014-04-02 MED ORDER — DIPHENHYDRAMINE HCL 50 MG/ML IJ SOLN
25.0000 mg | Freq: Once | INTRAMUSCULAR | Status: AC
  Administered 2014-04-02: 25 mg via INTRAVENOUS

## 2014-04-02 MED ORDER — METOCLOPRAMIDE HCL 10 MG PO TABS: 10.0000 mg | ORAL_TABLET | Freq: Four times a day (QID) | ORAL | Status: AC | PRN

## 2014-04-02 MED ORDER — ONDANSETRON HCL 4 MG/2ML IJ SOLN: 4.0000 mg | Freq: Once | INTRAMUSCULAR | Status: DC

## 2014-04-02 MED ORDER — SODIUM CHLORIDE 0.9 % IV BOLUS (SEPSIS)
1000.0000 mL | Freq: Once | INTRAVENOUS | Status: AC
  Administered 2014-04-02: 1000 mL via INTRAVENOUS

## 2014-04-02 MED ORDER — FAMOTIDINE 20 MG PO TABS: 20.0000 mg | ORAL_TABLET | Freq: Two times a day (BID) | ORAL | Status: AC | PRN

## 2014-04-02 NOTE — ED Provider Notes (Signed)
CSN: [id number]     Arrival date & time 04/02/14  1504 History   First MD Initiated Contact with Patient 04/02/14 1620     Chief Complaint  Patient presents with  . Abdominal Pain     (Consider location/radiation/quality/duration/timing/severity/associated sxs/prior Treatment) The history is provided by the patient.  Jillian [name] is a 80 y.o. female hx of GERD, anemia on arinef, who presenting with abdominal pain and nausea. Symptoms have been going on for the last month but worse in the last week. Came in a week ago and had CT that shows colitis. Patient was placed on Cipro and Flagyl. Over the last week she has persistent nausea has not improved with Zofran. She hasn't been compliant with her Prilosec was told by primary care doctor to take a daily. She denies any vomiting but just very nauseated. Denies any fevers or chills. She is still passing gas.    Past Medical History  Diagnosis Date  . Osteoporosis   . Ankle fracture, bimalleolar, closed   . Arthritis   . GERD (gastroesophageal reflux disease)   . Hyperlipemia   . Rectal prolapse     not treated yet  . Blood transfusion without reported diagnosis   . Complication of anesthesia     Takes longer time for patient to awake after anesthesia  . PONV (postoperative nausea and vomiting)   . Shortness of breath     when having arm pain  . Depression   . Difficulty swallowing    Past Surgical History  Procedure Laterality Date  . Hemorrhoid surgery    . Fracture surgery    . Tear duct probing Left 03/2011  . Orif ankle fracture  11/07/2011    Procedure: OPEN REDUCTION INTERNAL FIXATION (ORIF) ANKLE FRACTURE;  Surgeon: Drucilla SchmidtJames P Aplington, MD;  Location: [facility];  Service: Orthopedics;  Laterality: Left;  . Hardware removal  02/01/2012    Procedure: HARDWARE REMOVAL;  Surgeon: Drucilla SchmidtJames P Aplington, MD;  [facility] ;  Service: Orthopedics;  Laterality: Left;  MIN C-ARM  left ankle hardware removal   .  Colonoscopy    . Cataract extraction Right   . Toe surgery Left     little toe  . Anterior cervical decomp/discectomy fusion N/A 03/01/2014    Procedure: Cervical four/five, five/six. Anterior cervical decompression/diskectomy/fusion;  Surgeon: Temple PaciniHenry A Pool, MD;  Location: [facility];  Service: Neurosurgery;  Laterality: N/A;   Family History  Problem Relation Age of Onset  . Heart disease Mother   . Cancer Father     stomach  . Cancer Brother     stomach   History  Substance Use Topics  . Smoking status: Former Smoker    Quit date: 05/01/1943  . Smokeless tobacco: Never Used  . Alcohol Use: No   OB History    No data available     Review of Systems  Gastrointestinal: Positive for nausea and abdominal pain.  All other systems reviewed and are negative.     Allergies  Doxycycline; Aspirin; Ibuprofen; and Sulfa antibiotics  Home Medications   Prior to Admission medications   Medication Sig Start Date End Date Taking? Authorizing Provider  Calcium Carbonate (CALTRATE 600 PO) Take 1,200 mg by mouth daily.   Yes Historical Provider, MD  ciprofloxacin (CIPRO) 500 MG/5ML (10%) suspension Take 5 mLs (500 mg total) by mouth 2 (two) times daily. 03/28/14  Yes Roxy Horsemanobert Browning, PA-C  denosumab (PROLIA) 60 MG/ML SOLN injection Inject 60 mg into  the skin every 6 (six) months. Administer in upper arm, thigh, or abdomen   Yes Historical Provider, MD  ergocalciferol (VITAMIN D2) 50000 UNITS capsule Take 50,000 Units by mouth once a week.   Yes Historical Provider, MD  HYDROcodone-acetaminophen (HYCET) 7.5-325 mg/15 ml solution Take 15 mLs by mouth every 6 (six) hours as needed for moderate pain or severe pain. 03/29/14  Yes Roxy Horsemanobert Browning, PA-C  metroNIDAZOLE (FLAGYL) 50 mg/ml oral suspension Take 10 mLs (500 mg total) by mouth 3 (three) times daily. 03/28/14  Yes Roxy Horsemanobert Browning, PA-C  omeprazole (PRILOSEC) 20 MG capsule Take 20 mg by mouth daily.  08/03/13  Yes Historical Provider, MD   ondansetron (ZOFRAN ODT) 4 MG disintegrating tablet Take 1 tablet (4 mg total) by mouth every 8 (eight) hours as needed for nausea or vomiting. 03/29/14  Yes Roxy Horsemanobert Browning, PA-C  ondansetron (ZOFRAN) 4 MG tablet Take 4 mg by mouth daily as needed for nausea or vomiting. Takes with pain meds   Yes Historical Provider, MD  pravastatin (PRAVACHOL) 20 MG tablet Take 20 mg by mouth daily.   Yes Historical Provider, MD  simethicone (MYLICON) 80 MG chewable tablet Chew 80 mg by mouth 4 (four) times daily as needed for flatulence.   Yes Historical Provider, MD   BP 147/75 mmHg  Pulse 66  Temp(Src) 97.8 F (36.6 C) (Oral)  Resp 11  SpO2 100% Physical Exam  Constitutional: She is oriented to person, place, and time.  Uncomfortable   HENT:  Head: Normocephalic.  MM slightly dry   Eyes: Conjunctivae are normal. Pupils are equal, round, and reactive to light.  Neck: Normal range of motion. Neck supple.  Cardiovascular: Normal rate and regular rhythm.   Pulmonary/Chest: Effort normal and breath sounds normal. No respiratory distress. She has no wheezes. She has no rales.  Abdominal: Soft. Bowel sounds are normal.  Mild epigastric tenderness, no rebound   Musculoskeletal: Normal range of motion. She exhibits no edema or tenderness.  Neurological: She is alert and oriented to person, place, and time. No cranial nerve deficit. Coordination normal.  Skin: Skin is warm and dry.  Psychiatric: She has a normal mood and affect. Her behavior is normal. Judgment and thought content normal.  Nursing note and vitals reviewed.   ED Course  Procedures (including critical care time) Labs Review Labs Reviewed  CBC WITH DIFFERENTIAL - Abnormal; Notable for the following:    WBC 2.5 (*)    Platelets 106 (*)    Neutro Abs 1.6 (*)    Lymphs Abs 0.5 (*)    Monocytes Relative 13 (*)    All other components within normal limits  COMPREHENSIVE METABOLIC PANEL - Abnormal; Notable for the following:    Albumin  3.3 (*)    AST 56 (*)    GFR calc non Af Amer 77 (*)    GFR calc Af Amer 90 (*)    Anion gap 19 (*)    All other components within normal limits  URINALYSIS, ROUTINE W REFLEX MICROSCOPIC - Abnormal; Notable for the following:    Ketones, ur >80 (*)    Leukocytes, UA MODERATE (*)    All other components within normal limits  URINE MICROSCOPIC-ADD ON - Abnormal; Notable for the following:    Squamous Epithelial / LPF FEW (*)    Bacteria, UA FEW (*)    All other components within normal limits  URINE CULTURE  LIPASE, BLOOD  I-STAT TROPOININ, ED    Imaging Review Dg Neck Soft  Tissue  04/02/2014   CLINICAL DATA:  Epigastric pain for 1 week, generalized weakness, trouble swallowing  EXAM: NECK SOFT TISSUES - 1+ VIEW  COMPARISON:  None.  FINDINGS: There is no evidence of retropharyngeal soft tissue swelling or epiglottic enlargement. The cervical airway is unremarkable and no radio-opaque foreign body identified. There is bilateral carotid artery atherosclerosis.  Anterior cervical disc fusion from C4 through C6 without hardware failure or complication. There is adjacent segment degenerative disc disease at C6-7. There is minimal anterolisthesis of C6 on C7 measuring 2 mm.  IMPRESSION: No acute neck soft tissue abnormality.   Electronically Signed   By: Elige Ko   On: 04/02/2014 18:44   Dg Abd Acute W/chest  04/02/2014   CLINICAL DATA:  Acute epigastric pain for 1 week, generalized weakness, difficulty swallowing  EXAM: ACUTE ABDOMEN SERIES (ABDOMEN 2 VIEW & CHEST 1 VIEW)  COMPARISON:  12/01/2004  FINDINGS: Stable cardiomegaly without CHF or edema. Mild hyperinflation with similar upper lobe and bibasilar scarring. No focal pneumonia, collapse or consolidation. No edema, effusion or pneumothorax. Trachea midline. Lower cervical fusion hardware evident. Atherosclerosis of the ectatic aorta.  No free air. Nonobstructive bowel gas pattern. No significant dilatation or ileus. Residual contrast  throughout the colon. Degenerative changes of both hips.  IMPRESSION: Chronic findings as above. No acute chest or abdominal process by plain radiography.  Negative for obstruction or free air.   Electronically Signed   By: Ruel Favors M.D.   On: 04/02/2014 18:44     EKG Interpretation None      Date: 04/02/2014  Rate: 61  Rhythm: normal sinus rhythm  QRS Axis: normal  Intervals: normal  ST/T Wave abnormalities: nonspecific ST changes  Conduction Disutrbances:none  Narrative Interpretation:   Old EKG Reviewed: none available     MDM   Final diagnoses:  Epigastric pain  Trouble swallowing    Jillian [name] is a 79 y.o. female here with epigastric pain, nausea. Has persistent L arm numbness but unchanged since recent surgery. Given nl CT ab/pel, SBO is unlikely. Likely persistent reflux. Will get xrays, labs, UA.   8:16 PM Xrays unremarkable. Felt better with meds. UA + UTI. I think her symptoms likely combination of reflux and UTI. Given ceftriaxone. She is on cipro/flagyl already which exacerbated her nausea. I hesitate to start another antibiotic in addition to that. Will add urine culture, if resistant to cipro, then will call in another abx. Will change zofran to reglan, add prn pepcid.      Richardean Canal, MD 04/02/14 2017

## 2014-04-02 NOTE — ED Notes (Signed)
Pt reporting now she feels extremity weakness and numbness in her extremities X1 month. She reports she had a cervical disc surgery at that time.

## 2014-04-02 NOTE — ED Notes (Signed)
[facility]- pt from home RUQ abd pain X4 days. Reports it "feels like a blockage." Pt also reporting nausea, denies vomiting. Was treated here last week for lower abdominal pain with Cipro.

## 2014-04-02 NOTE — Discharge Instructions (Signed)
Take prilosec.   Continue cipro, flagyl.   Take reglan as needed for nausea.   Take pepcid as needed for stomach upset.   See your GI doctor next week.   Return to ER if you have fever, severe pain, nausea, vomiting.

## 2014-04-05 ENCOUNTER — Telehealth (HOSPITAL_COMMUNITY): Payer: Self-pay

## 2014-04-05 LAB — URINE CULTURE: Colony Count: 35000

## 2014-04-06 ENCOUNTER — Telehealth (HOSPITAL_BASED_OUTPATIENT_CLINIC_OR_DEPARTMENT_OTHER): Payer: Self-pay | Admitting: Emergency Medicine

## 2014-04-06 NOTE — Telephone Encounter (Signed)
Post ED Visit - Positive Culture Follow-up: Successful Patient Follow-Up  Culture assessed and recommendations reviewed by: []  Jillian [name], Pharm.D., BCPS []  Jillian [name], Pharm.D., BCPS [x]  Jillian [name], Pharm.D., BCPS []  Jillian [name], [address]Pharm[address]D., BCPS, AAHIVP []  Jillian [name], Pharm.D., BCPS, AAHIVP []  Jillian [name], Pharm.D. []  Jillian [name], Pharm.D.  Positive urine culture MRSA 35,000 colonies  [x]  [name] []  Organism is resistant to prescribed ED discharge antimicrobial []  Patient with positive blood cultures  Changes discussed with ED provider: kirichencko PA New antibiotic prescription doxycycline 100mg  po bid x 10 days Pt declined further treatment d/t previous reaction to doxycycline and no symptoms according to pt  04/06/2014 1550   Jillian [name], Jillian [name] 04/06/2014, 3:58 PM

## 2014-04-06 NOTE — Progress Notes (Signed)
ED Antimicrobial Stewardship Positive Culture Follow Up   Jillian [name] is an 80 y.o. female who presented to [facility] on 04/02/2014 with a chief complaint of abdominal pain Chief Complaint  Patient presents with  . Abdominal Pain    Recent Results (from the past 720 hour(s))  Urine culture     Status: None   Collection Time: 04/02/14  5:54 PM  Result Value Ref Range Status   Specimen Description URINE, CATHETERIZED  Final   Special Requests NONE  Final   Culture  Setup Time   Final    04/03/2014 01:33 Performed at [facility]    Colony Count   Final    35,000 COLONIES/ML Performed at [facility]    Culture   Final    METHICILLIN RESISTANT STAPHYLOCOCCUS AUREUS Note: RIFAMPIN AND GENTAMICIN SHOULD NOT BE USED AS SINGLE DRUGS FOR TREATMENT OF STAPH INFECTIONS. CRITICAL RESULT CALLED TO, READ BACK BY AND VERIFIED WITH: TONY FESTERMAN @ 1:08PM 04/05/14 BY HAJAM Performed at [facility]    Report Status 04/05/2014 FINAL  Final   Organism ID, Bacteria METHICILLIN RESISTANT STAPHYLOCOCCUS AUREUS  Final      Susceptibility   Methicillin resistant staphylococcus aureus - MIC*    GENTAMICIN <=0.5 SENSITIVE Sensitive     LEVOFLOXACIN >=8 RESISTANT Resistant     NITROFURANTOIN 256 RESISTANT Resistant     OXACILLIN >=4 RESISTANT Resistant     PENICILLIN >=0.5 RESISTANT Resistant     RIFAMPIN <=0.5 SENSITIVE Sensitive     TRIMETH/SULFA <=10 SENSITIVE Sensitive     VANCOMYCIN <=0.5 SENSITIVE Sensitive     TETRACYCLINE <=1 SENSITIVE Sensitive     * METHICILLIN RESISTANT STAPHYLOCOCCUS AUREUS    [x]  Patient discharged originally without antimicrobial agent and treatment is now indicated  [id number] YOF who [facility] with abdominal pain/nausea. The patient visited the ED on 11/29 and was diagnosed with colitis and started on Cipro + Flagyl. The patient is also noted to have a recent cervical compression procedure done on 11/2 to help with neck pain  and upper extremity weakness/sensory loss. The patient does have history of prior hardware however this appears to have been removed in 2013. UA shows a few squamous cells which could indicate contamination.  The patient grew 35k of MRSA in the UCx - this is normally not a urinary pathogen however could be due to the patient's recent hospitalization, antibiotics, and squamous cells on UA. Would recommend treating with antibiotics and getting a repeat urine culture 1 week after completion of antibiotics to ensure cleared. If it grows out again - the patient will need further work-up.  The patient should complete the course of Cipro/Flagyl today (started on 11/29 for 10 days)  New antibiotic prescription: Doxycycline 100 mg bid x 10 days. After antibiotics are completed - go to PCP office for repeat urine culture to ensure cleared. If staph grows out again - will need further work-up.   ED Provider: Jaynie Crumbleatyana Kirichenko, PA-C  Rolley SimsMartin, Thecla Forgione Ann 04/06/2014, 9:19 AM Infectious Diseases Pharmacist Phone# [phone]

## 2014-04-10 ENCOUNTER — Telehealth: Payer: Self-pay | Admitting: *Deleted

## 2014-04-15 ENCOUNTER — Telehealth (HOSPITAL_BASED_OUTPATIENT_CLINIC_OR_DEPARTMENT_OTHER): Payer: Self-pay | Admitting: Emergency Medicine

## 2014-04-26 ENCOUNTER — Ambulatory Visit
Admission: RE | Admit: 2014-04-26 | Discharge: 2014-04-26 | Disposition: A | Discharge status: Home / Self Care | Payer: Medicare HMO | Source: Ambulatory Visit

## 2014-04-26 DIAGNOSIS — Z1231 Encounter for screening mammogram for malignant neoplasm of breast: Secondary | ICD-10-CM

## 2014-05-24 ENCOUNTER — Other Ambulatory Visit: Payer: Self-pay | Admitting: Physician Assistant

## 2014-05-24 DIAGNOSIS — R1310 Dysphagia, unspecified: Secondary | ICD-10-CM

## 2014-06-01 ENCOUNTER — Other Ambulatory Visit: Payer: Medicare HMO

## 2014-10-26 ENCOUNTER — Other Ambulatory Visit: Payer: Self-pay | Admitting: Gastroenterology

## 2014-10-26 ENCOUNTER — Ambulatory Visit
Admission: RE | Admit: 2014-10-26 | Discharge: 2014-10-26 | Disposition: A | Discharge status: Home / Self Care | Payer: Medicare HMO | Source: Ambulatory Visit | Attending: Gastroenterology | Admitting: Gastroenterology

## 2014-10-26 DIAGNOSIS — R1030 Lower abdominal pain, unspecified: Secondary | ICD-10-CM

## 2014-10-28 ENCOUNTER — Other Ambulatory Visit: Payer: Self-pay | Admitting: Gastroenterology

## 2014-10-28 ENCOUNTER — Ambulatory Visit
Admission: RE | Admit: 2014-10-28 | Discharge: 2014-10-28 | Disposition: A | Discharge status: Home / Self Care | Payer: Medicare HMO | Source: Ambulatory Visit | Attending: Gastroenterology | Admitting: Gastroenterology

## 2014-10-28 DIAGNOSIS — R109 Unspecified abdominal pain: Secondary | ICD-10-CM

## 2015-02-03 ENCOUNTER — Ambulatory Visit (HOSPITAL_COMMUNITY)
Admission: RE | Admit: 2015-02-03 | Discharge: 2015-02-03 | Disposition: A | Discharge status: Home / Self Care | Payer: Medicare HMO | Source: Ambulatory Visit | Attending: Internal Medicine | Admitting: Internal Medicine

## 2015-02-23 IMAGING — CT CT ABD-PELV W/ CM
2 of 5 series · 16 of 46 positions shown, 18 images · IV contrast (Omni 300)
Comparison: None.

CLINICAL DATA: 88-year-old female with abdominal and pelvic pain,
nausea, vomiting and diarrhea.

EXAM:
CT ABDOMEN AND PELVIS WITH CONTRAST
TECHNIQUE: Multidetector CT imaging of the abdomen and pelvis was performed
using the standard protocol following bolus administration of
intravenous contrast.
CONTRAST:  100mL OMNIPAQUE IOHEXOL 300 MG/ML  SOLN

[Series 2: abd/ pelvis 5.0 i30f 1 · axial · 0.80mm/px · z∈[-48,+422]mm · 13 of 104 slices shown, 15 images]
[im 5/104  soft-tissue]
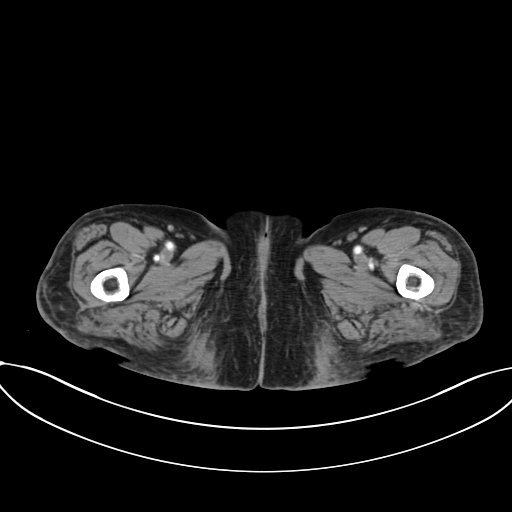
[im 5/104  bone]
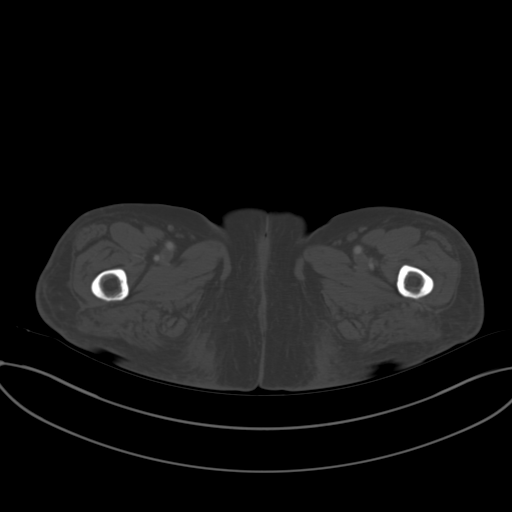
[im 15/104  soft-tissue]
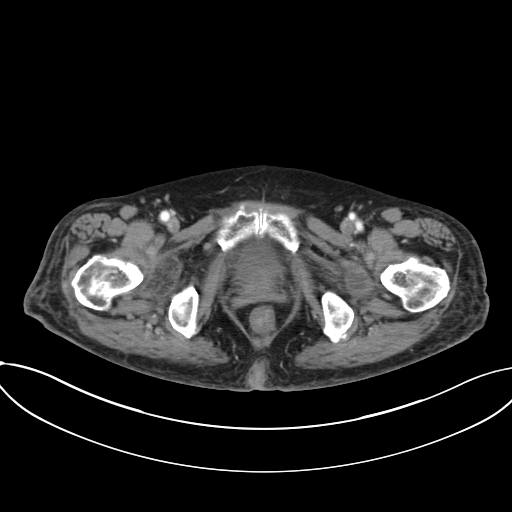
[im 20/104  soft-tissue]
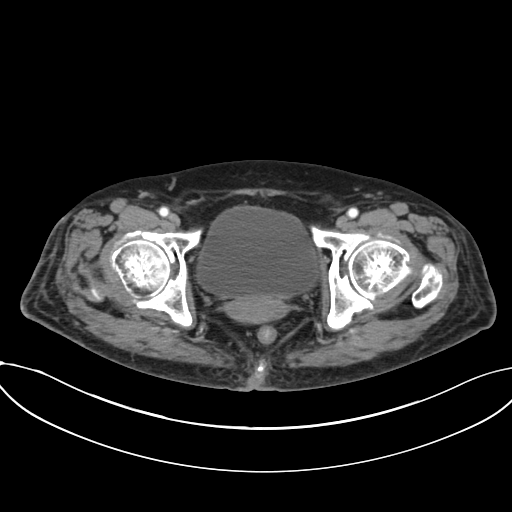
[im 30/104  soft-tissue]
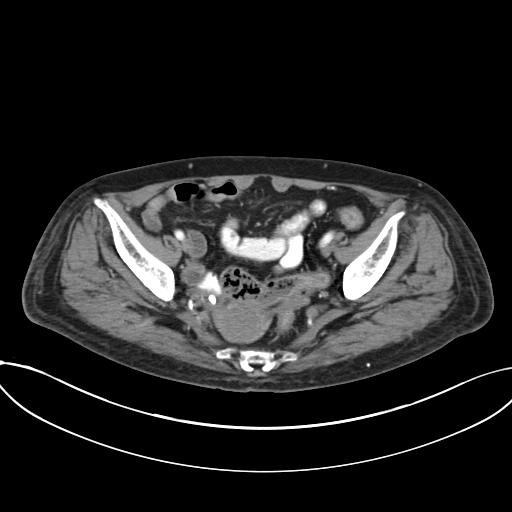
[im 35/104  soft-tissue]
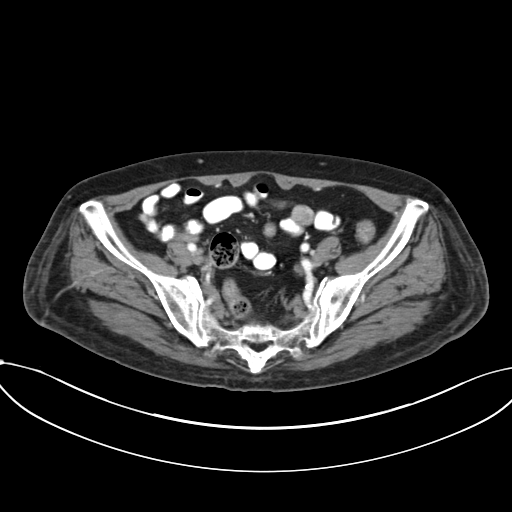
[im 45/104  soft-tissue]
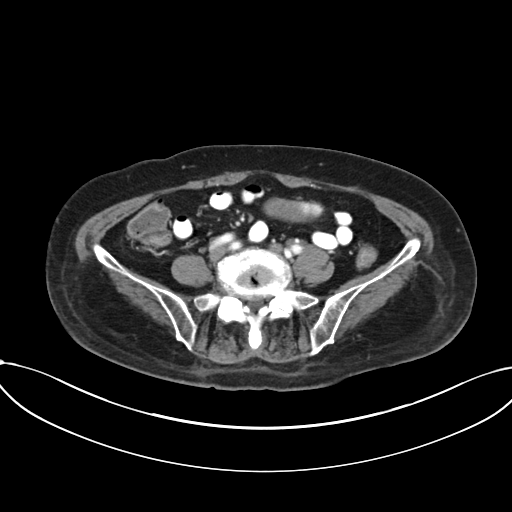
[im 54/104  soft-tissue]
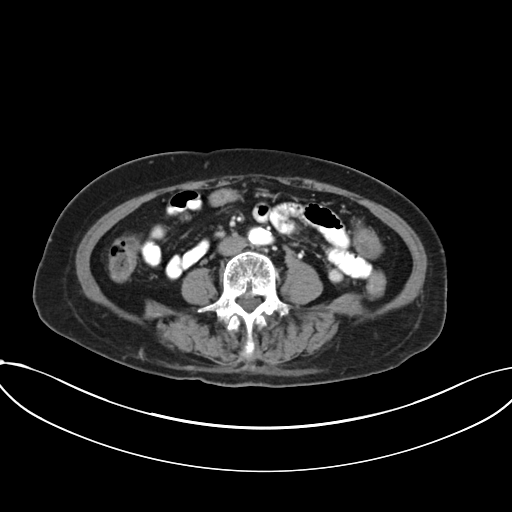
[im 59/104  soft-tissue]
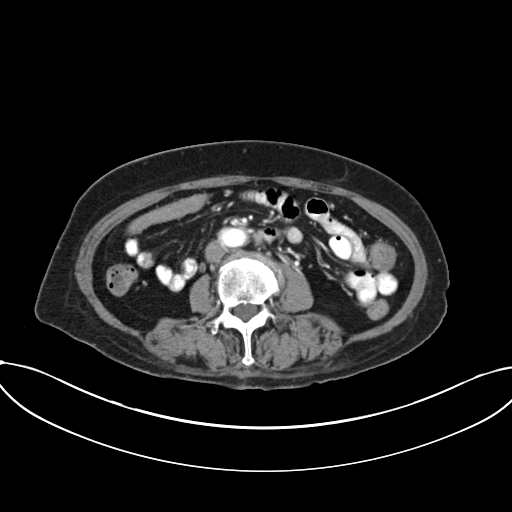
[im 69/104  soft-tissue]
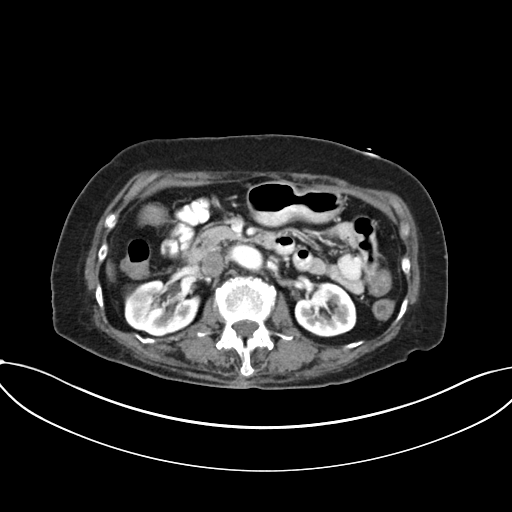
[im 69/104  bone]
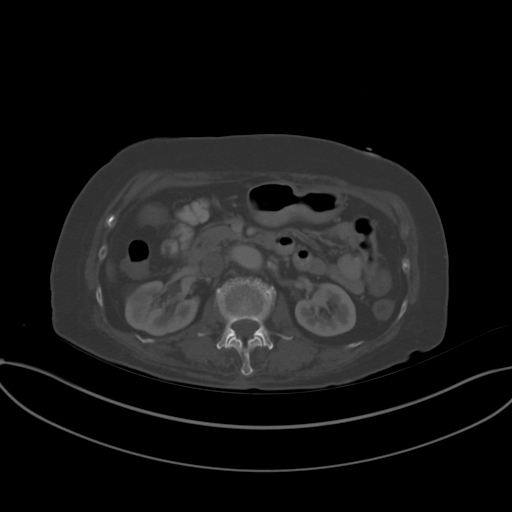
[im 74/104  soft-tissue]
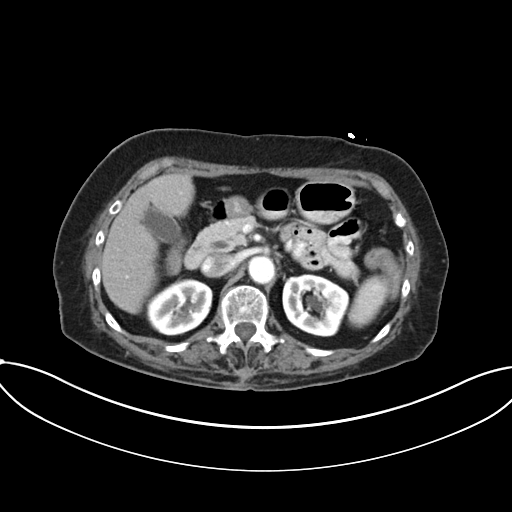
[im 84/104  soft-tissue]
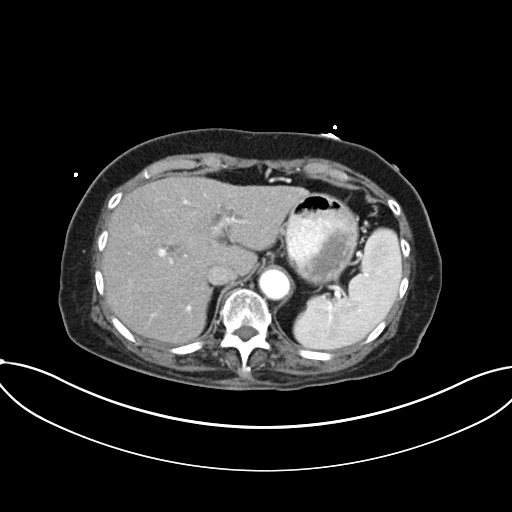
[im 89/104  soft-tissue]
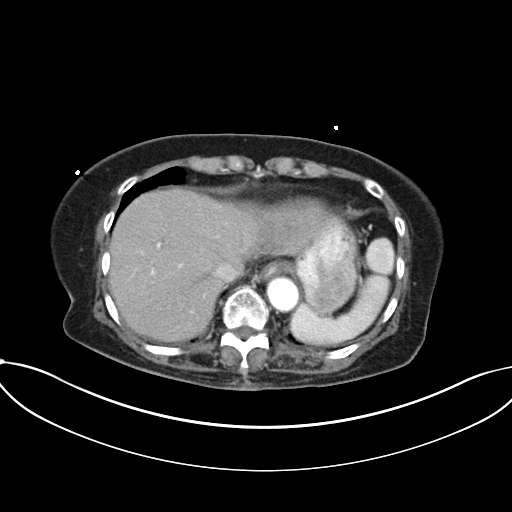
[im 99/104  soft-tissue]
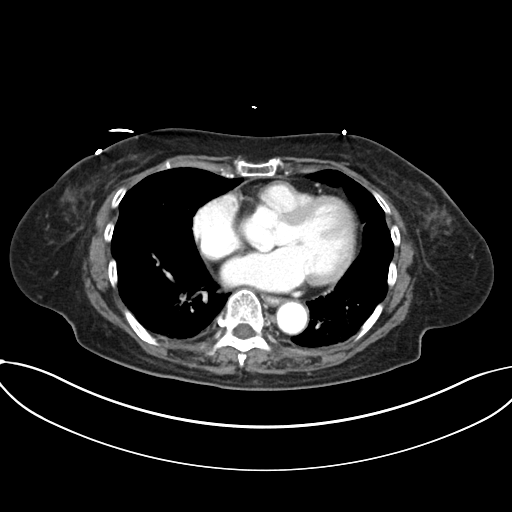

[Series 5: coronals · coronal · 0.71mm/px · 3 of 101 slices shown]
[im 34/101  soft-tissue]
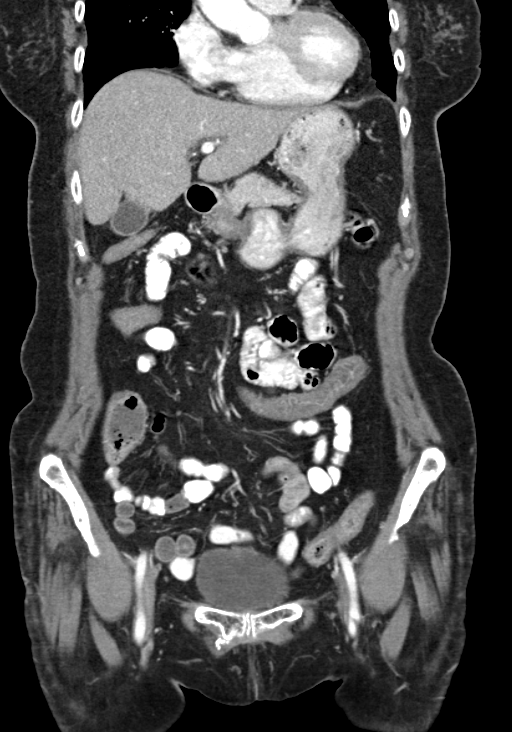
[im 45/101  soft-tissue]
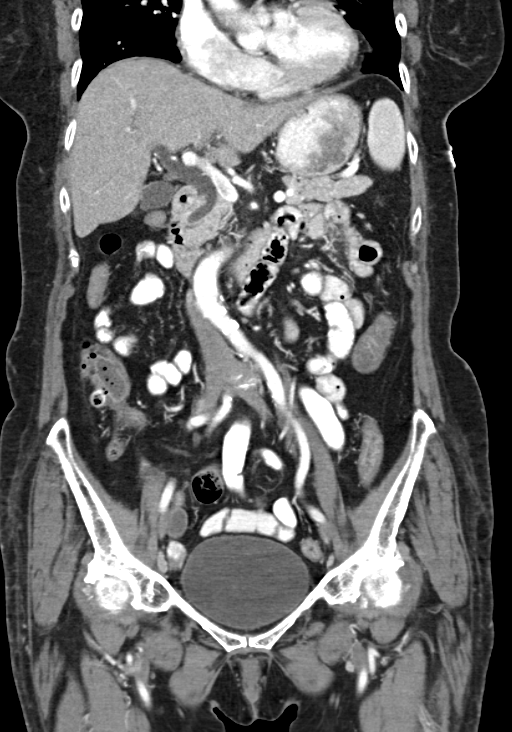
[im 56/101  soft-tissue]
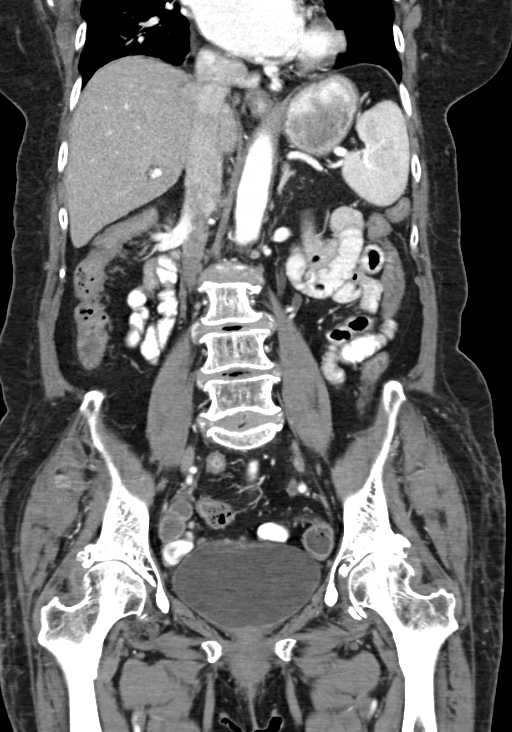

[16 of 46 positions shown; findings below may reference images not displayed]

FINDINGS: Cardiomegaly identified.

There is mild circumferential wall thickening of the transverse and
descending colon compatible with a colitis, likely inflammatory or
infectious.

The visualized celiac artery, SMA, IMA and SMV are patent.

There is no evidence of bowel obstruction, pneumoperitoneum or
abscess.

The slightly nodular hepatic contour is noted -question cirrhosis.

The spleen, pancreas, adrenal glands and gallbladder are
unremarkable.

Bilateral renal cortical atrophy, right renal cyst and duplicated
left renal collecting system noted.

There is no evidence of free fluid, enlarged lymph nodes, biliary
dilation or abdominal aortic aneurysm.

No acute or suspicious bony abnormalities are noted. Degenerative
changes in both hips and lumbar spine noted. A right hip effusion is
noted and likely chronic.
IMPRESSION: Transverse and descending colitis -likely inflammatory or
infectious. No evidence of bowel obstruction, pneumoperitoneum or
abscess.

Slightly nodular hepatic contour -question cirrhosis.

Degenerative changes within the lumbar spine and bilateral hips with
right hip effusion, likely chronic.

Cardiomegaly.

## 2015-07-30 DEATH — deceased
